# Patient Record
Sex: Female | Born: 1985 | Race: Black or African American | Hispanic: No | Marital: Single | Smoking: Current every day smoker
Health system: Southern US, Community
[De-identification: ages and names within clinical notes are randomized; demographics above are authoritative.]

## PROBLEM LIST (undated history)

## (undated) DIAGNOSIS — J45909 Unspecified asthma, uncomplicated: Secondary | ICD-10-CM

## (undated) HISTORY — PX: OVARIAN CYST REMOVAL: SHX89

---

## 2005-03-26 ENCOUNTER — Emergency Department (HOSPITAL_COMMUNITY): Admission: EM | Admit: 2005-03-26 | Discharge: 2005-03-26 | Payer: Self-pay | Admitting: Family Medicine

## 2005-04-15 ENCOUNTER — Emergency Department (HOSPITAL_COMMUNITY): Admission: EM | Admit: 2005-04-15 | Discharge: 2005-04-15 | Payer: Self-pay | Admitting: Family Medicine

## 2007-01-21 ENCOUNTER — Emergency Department (HOSPITAL_COMMUNITY): Admission: EM | Admit: 2007-01-21 | Discharge: 2007-01-21 | Payer: Self-pay

## 2008-05-05 ENCOUNTER — Inpatient Hospital Stay (HOSPITAL_COMMUNITY): Admission: AD | Admit: 2008-05-05 | Discharge: 2008-05-05 | Payer: Self-pay | Admitting: Obstetrics & Gynecology

## 2012-06-08 ENCOUNTER — Encounter (HOSPITAL_BASED_OUTPATIENT_CLINIC_OR_DEPARTMENT_OTHER): Payer: Self-pay | Admitting: *Deleted

## 2012-06-08 ENCOUNTER — Emergency Department (HOSPITAL_BASED_OUTPATIENT_CLINIC_OR_DEPARTMENT_OTHER)
Admission: EM | Admit: 2012-06-08 | Discharge: 2012-06-09 | Disposition: A | Discharge status: Home / Self Care | Payer: Medicaid Other | Attending: Emergency Medicine | Admitting: Emergency Medicine

## 2012-06-08 DIAGNOSIS — L03211 Cellulitis of face: Secondary | ICD-10-CM | POA: Insufficient documentation

## 2012-06-08 DIAGNOSIS — K011 Impacted teeth: Secondary | ICD-10-CM

## 2012-06-08 DIAGNOSIS — K006 Disturbances in tooth eruption: Secondary | ICD-10-CM | POA: Insufficient documentation

## 2012-06-08 DIAGNOSIS — Z885 Allergy status to narcotic agent status: Secondary | ICD-10-CM | POA: Insufficient documentation

## 2012-06-08 DIAGNOSIS — F17200 Nicotine dependence, unspecified, uncomplicated: Secondary | ICD-10-CM | POA: Insufficient documentation

## 2012-06-08 DIAGNOSIS — L0201 Cutaneous abscess of face: Secondary | ICD-10-CM | POA: Insufficient documentation

## 2012-06-08 HISTORY — DX: Unspecified asthma, uncomplicated: J45.909

## 2012-06-08 NOTE — ED Notes (Signed)
Pt states she has had dental pain x 1 week and also noticed a raised reddened area on her forehead yesterday.

## 2012-06-09 MED ORDER — HYDROCODONE-ACETAMINOPHEN 5-325 MG PO TABS: 1.0000 | ORAL_TABLET | Freq: Four times a day (QID) | ORAL | Status: AC | PRN

## 2012-06-09 MED ORDER — DOXYCYCLINE HYCLATE 100 MG PO TABS
100.0000 mg | ORAL_TABLET | Freq: Once | ORAL | Status: AC
  Administered 2012-06-09: 100 mg via ORAL
  Filled 2012-06-09: qty 1

## 2012-06-09 MED ORDER — DOXYCYCLINE HYCLATE 100 MG PO CAPS: 100.0000 mg | ORAL_CAPSULE | Freq: Two times a day (BID) | ORAL | Status: AC

## 2012-06-09 NOTE — ED Provider Notes (Signed)
History   This chart was scribed for Hanley Seamen, MD by Toya Smothers. The patient was seen in room MH04/MH04. Patient's care was started at 2343.  CSN: [id number]  Arrival date & time 06/08/12  2343   First MD Initiated Contact with Patient 06/09/12 0020      Chief Complaint  Patient presents with  . Dental Pain   Patient is a 26 y.o. female presenting with tooth pain. The history is provided by the patient. No language interpreter was used.  Dental PainPrimary symptoms do not include headaches, fever, shortness of breath or cough.   Meagan Sellers is a 26 y.o. female who presents to the Emergency Department complaining of onset moderate-severe constant L third molar pain onset 1 week ago. Pain is improving, moderate. Pt also developed pustule  on the bridge of the nose which concerned her. The dental pain is exacerbated by ED in. The pustule is tender to palpation. She denies fevers or chills. She does have some left anterior cervical lymphadenopathy.  Past Medical History  Diagnosis Date  . Asthma     History reviewed. No pertinent past surgical history.  History reviewed. No pertinent family history.  History  Substance Use Topics  . Smoking status: Current Everyday Smoker  . Smokeless tobacco: Not on file  . Alcohol Use: Yes   Review of Systems  Constitutional: Negative for fever.  HENT: Positive for dental problem. Negative for rhinorrhea.        Pustule on bridge of nose.  Eyes: Negative for pain.  Respiratory: Negative for cough and shortness of breath.   Cardiovascular: Negative for chest pain.  Gastrointestinal: Negative for nausea, vomiting, abdominal pain and diarrhea.  Genitourinary: Negative for dysuria.  Musculoskeletal: Negative for back pain.  Skin: Negative for rash.  Neurological: Negative for weakness and headaches.  All other systems reviewed and are negative.    Allergies  Demerol  Home Medications  No current outpatient prescriptions on  file.  BP 119/71  Pulse 74  Temp 98.2 F (36.8 C) (Oral)  Resp 18  Ht 5\' 3"  (1.6 m)  Wt 142 lb (64.411 kg)  BMI 25.15 kg/m2  SpO2 100%  Physical Exam  Nursing note and vitals reviewed. Constitutional: She is oriented to person, place, and time. She appears well-developed and well-nourished. No distress.  HENT:  Head: Normocephalic and atraumatic.       Partially erupted wisdom teeth.  Non-pointing non-fluctuating abscess of Left bridge of the nose.  Eyes: EOM are normal. Pupils are equal, round, and reactive to light.  Neck: Neck supple. No tracheal deviation present.  Cardiovascular: Normal rate.   Pulmonary/Chest: Effort normal. No respiratory distress.  Abdominal: Soft. She exhibits no distension.  Musculoskeletal: Normal range of motion. She exhibits no edema.  Lymphadenopathy:       Left anterior cervical lymphadenopathy  Neurological: She is alert and oriented to person, place, and time. No sensory deficit.  Skin: Skin is warm and dry.  Psychiatric: She has a normal mood and affect. Her behavior is normal.    ED Course  Procedures (including critical care time) DIAGNOSTIC STUDIES: Oxygen Saturation is 100% on room air, normal by my interpretation.    COORDINATION OF CARE: 2423-Evaluated state of Pt's present illness. Pt will need to see oral surgeon.    MDM  I personally performed the services described in this documentation, which was scribed in my presence.  The recorded information has been reviewed and considered. We'll treat the patient's abscess with  doxycycline as it is not yet ready for I&D. She was advised to return if it worsens. We will refer her to an oral surgeon for her impacted wisdom teeth. No caries or evidence of abscess were seen on exam.      Hanley Seamen, MD 06/09/12 [phone]

## 2012-12-29 ENCOUNTER — Emergency Department (HOSPITAL_BASED_OUTPATIENT_CLINIC_OR_DEPARTMENT_OTHER): Payer: Medicaid Other

## 2012-12-29 ENCOUNTER — Encounter (HOSPITAL_BASED_OUTPATIENT_CLINIC_OR_DEPARTMENT_OTHER): Payer: Self-pay | Admitting: *Deleted

## 2012-12-29 ENCOUNTER — Emergency Department (HOSPITAL_BASED_OUTPATIENT_CLINIC_OR_DEPARTMENT_OTHER)
Admission: EM | Admit: 2012-12-29 | Discharge: 2012-12-29 | Disposition: A | Discharge status: Home / Self Care | Payer: Medicaid Other | Attending: Emergency Medicine | Admitting: Emergency Medicine

## 2012-12-29 DIAGNOSIS — R51 Headache: Secondary | ICD-10-CM | POA: Insufficient documentation

## 2012-12-29 DIAGNOSIS — IMO0001 Reserved for inherently not codable concepts without codable children: Secondary | ICD-10-CM | POA: Insufficient documentation

## 2012-12-29 DIAGNOSIS — J3489 Other specified disorders of nose and nasal sinuses: Secondary | ICD-10-CM | POA: Insufficient documentation

## 2012-12-29 DIAGNOSIS — R509 Fever, unspecified: Secondary | ICD-10-CM | POA: Insufficient documentation

## 2012-12-29 DIAGNOSIS — R112 Nausea with vomiting, unspecified: Secondary | ICD-10-CM | POA: Insufficient documentation

## 2012-12-29 DIAGNOSIS — R05 Cough: Secondary | ICD-10-CM | POA: Insufficient documentation

## 2012-12-29 DIAGNOSIS — F17200 Nicotine dependence, unspecified, uncomplicated: Secondary | ICD-10-CM | POA: Insufficient documentation

## 2012-12-29 DIAGNOSIS — J45909 Unspecified asthma, uncomplicated: Secondary | ICD-10-CM | POA: Insufficient documentation

## 2012-12-29 DIAGNOSIS — J111 Influenza due to unidentified influenza virus with other respiratory manifestations: Secondary | ICD-10-CM

## 2012-12-29 DIAGNOSIS — R059 Cough, unspecified: Secondary | ICD-10-CM | POA: Insufficient documentation

## 2012-12-29 DIAGNOSIS — R071 Chest pain on breathing: Secondary | ICD-10-CM | POA: Insufficient documentation

## 2012-12-29 DIAGNOSIS — Z79899 Other long term (current) drug therapy: Secondary | ICD-10-CM | POA: Insufficient documentation

## 2012-12-29 DIAGNOSIS — R630 Anorexia: Secondary | ICD-10-CM | POA: Insufficient documentation

## 2012-12-29 LAB — URINE MICROSCOPIC-ADD ON

## 2012-12-29 LAB — URINALYSIS, ROUTINE W REFLEX MICROSCOPIC
Leukocytes, UA: NEGATIVE
Nitrite: NEGATIVE
Protein, ur: 100 mg/dL — AB
Specific Gravity, Urine: 1.043 — ABNORMAL HIGH (ref 1.005–1.030)

## 2012-12-29 MED ORDER — ALBUTEROL SULFATE HFA 108 (90 BASE) MCG/ACT IN AERS: 2.0000 | INHALATION_SPRAY | RESPIRATORY_TRACT | Status: AC | PRN

## 2012-12-29 MED ORDER — KETOROLAC TROMETHAMINE 60 MG/2ML IM SOLN
30.0000 mg | Freq: Once | INTRAMUSCULAR | Status: AC
  Administered 2012-12-29: 30 mg via INTRAMUSCULAR
  Filled 2012-12-29: qty 2

## 2012-12-29 MED ORDER — ONDANSETRON 8 MG PO TBDP: ORAL_TABLET | ORAL | Status: DC

## 2012-12-29 MED ORDER — METOCLOPRAMIDE HCL 10 MG PO TABS: 10.0000 mg | ORAL_TABLET | Freq: Four times a day (QID) | ORAL | Status: DC | PRN

## 2012-12-29 NOTE — ED Provider Notes (Signed)
History   This chart was scribed for Hurman Horn, MD by Leone Payor, ED Scribe. This patient was seen in room MHOTF/OTF and the patient's care was started at 2055.   CSN: [id number]  Arrival date & time 12/29/12  1903   First MD Initiated Contact with Patient 12/29/12 2055      Chief Complaint  Patient presents with  . Cough     The history is provided by the patient. No language interpreter was used.    Meagan Sellers is a 27 y.o. female who presents to the Emergency Department complaining of new, unchanged, constant,  moderate cough starting 2 days ago. Pt has associated chills, fever, body aches, nausea, vomiting from excessive coughing, congestion, headaches, decreased appetite. Pt has inhaler at home. Pt has not had a bowel movement today. She denies abdominal pain, rash, visual changes, dysuria.   Pt has h/o asthma.  Pt is a current everyday smoker and occasional alcohol user.  Past Medical History  Diagnosis Date  . Asthma     History reviewed. No pertinent past surgical history.  No family history on file.  History  Substance Use Topics  . Smoking status: Current Every Day Smoker  . Smokeless tobacco: Not on file  . Alcohol Use: Yes    No OB history provided.   Review of Systems 10 Systems reviewed and all are negative for acute change except as noted in the HPI.    Allergies  Demerol  Home Medications   Current Outpatient Rx  Name  Route  Sig  Dispense  Refill  . ALBUTEROL SULFATE HFA 108 (90 BASE) MCG/ACT IN AERS   Inhalation   Inhale 2 puffs into the lungs every 4 (four) hours as needed for wheezing or shortness of breath (cough).   1 Inhaler   0   . METOCLOPRAMIDE HCL 10 MG PO TABS   Oral   Take 1 tablet (10 mg total) by mouth every 6 (six) hours as needed (nausea/headache).   6 tablet   0   . ONDANSETRON 8 MG PO TBDP      8mg  ODT q4 hours prn nausea   4 tablet   0     BP 120/80  Pulse 71  Temp 98.6 F (37 C) (Oral)  Resp 18   SpO2 100%  Physical Exam  Nursing note and vitals reviewed. Constitutional:       Awake, alert, nontoxic appearance.  HENT:  Head: Atraumatic.  Right Ear: External ear normal.  Left Ear: External ear normal.  Mouth/Throat: Oropharynx is clear and moist.  Eyes: Right eye exhibits no discharge. Left eye exhibits no discharge.  Neck: Neck supple.  Cardiovascular: Normal rate, regular rhythm and normal heart sounds.   Pulmonary/Chest: Effort normal and breath sounds normal. She exhibits no tenderness.       Diffuse chest wall tenderness.   Abdominal: Soft. There is no tenderness. There is no rebound.  Musculoskeletal: She exhibits no tenderness.       Baseline ROM, no obvious new focal weakness.  Diffuse back and all 4 extremity tenderness.   Lymphadenopathy:    She has no cervical adenopathy.  Neurological:       Mental status and motor strength appears baseline for patient and situation.  Skin: No rash noted.  Psychiatric: She has a normal mood and affect.    ED Course  Procedures (including critical care time)  DIAGNOSTIC STUDIES: Oxygen Saturation is 100% on room air, normal by my interpretation.  COORDINATION OF CARE:  9:01PM Patient / Family / Caregiver informed of clinical course, understand medical decision-making process, and agree with plan.   9:26PM Pt requests a Toradol shot before she leaves the ED.   Labs Reviewed  URINALYSIS, ROUTINE W REFLEX MICROSCOPIC - Abnormal; Notable for the following:    Color, Urine AMBER (*)  BIOCHEMICALS MAY BE AFFECTED BY COLOR   APPearance CLOUDY (*)     Specific Gravity, Urine 1.043 (*)     Hgb urine dipstick SMALL (*)     Bilirubin Urine SMALL (*)     Ketones, ur 15 (*)     Protein, ur 100 (*)     All other components within normal limits  URINE MICROSCOPIC-ADD ON - Abnormal; Notable for the following:    Squamous Epithelial / LPF FEW (*)     Bacteria, UA FEW (*)     All other components within normal limits   Dg  Chest 2 View  12/29/2012  *RADIOLOGY REPORT*  Clinical Data: Cough.  Fever.  Chills.  Body aches.  CHEST - 2 VIEW  Comparison: None.  Findings:  Cardiopericardial silhouette within normal limits. Mediastinal contours normal. Trachea midline.  No airspace disease or effusion.  IMPRESSION: No acute cardiopulmonary disease.   Original Report Authenticated By: Andreas Newport, M.D.      1. Influenza       MDM   I personally performed the services described in this documentation, which was scribed in my presence. The recorded information has been reviewed and is accurate.  Patient / Family / Caregiver informed of clinical course, understand medical decision-making process, and agree with plan.  I doubt any other EMC precluding discharge at this time including, but not necessarily limited to the following: SBI.  Hurman Horn, MD 12/30/12 [phone]

## 2012-12-29 NOTE — ED Notes (Signed)
Cough, body aches, vomiting, nausea, headache x 2 days.

## 2012-12-29 NOTE — ED Notes (Signed)
Pt. Reports pain with cough.

## 2013-03-08 ENCOUNTER — Encounter (HOSPITAL_BASED_OUTPATIENT_CLINIC_OR_DEPARTMENT_OTHER): Payer: Self-pay | Admitting: *Deleted

## 2013-03-08 DIAGNOSIS — Z79899 Other long term (current) drug therapy: Secondary | ICD-10-CM | POA: Insufficient documentation

## 2013-03-08 DIAGNOSIS — F17200 Nicotine dependence, unspecified, uncomplicated: Secondary | ICD-10-CM | POA: Insufficient documentation

## 2013-03-08 DIAGNOSIS — J45909 Unspecified asthma, uncomplicated: Secondary | ICD-10-CM | POA: Insufficient documentation

## 2013-03-08 DIAGNOSIS — M62838 Other muscle spasm: Secondary | ICD-10-CM | POA: Insufficient documentation

## 2013-03-08 NOTE — ED Notes (Signed)
Pt reports right shoulder pain x 1 month- has been using ibuprofen without relief- denies known injury

## 2013-03-09 ENCOUNTER — Emergency Department (HOSPITAL_BASED_OUTPATIENT_CLINIC_OR_DEPARTMENT_OTHER)
Admission: EM | Admit: 2013-03-09 | Discharge: 2013-03-09 | Disposition: A | Discharge status: Home / Self Care | Payer: Medicaid Other | Attending: Emergency Medicine | Admitting: Emergency Medicine

## 2013-03-09 ENCOUNTER — Emergency Department (HOSPITAL_BASED_OUTPATIENT_CLINIC_OR_DEPARTMENT_OTHER)
Admit: 2013-03-09 | Discharge: 2013-03-09 | Disposition: A | Discharge status: Home / Self Care | Payer: Medicaid Other | Attending: Emergency Medicine | Admitting: Emergency Medicine

## 2013-03-09 DIAGNOSIS — M62838 Other muscle spasm: Secondary | ICD-10-CM

## 2013-03-09 MED ORDER — TRAMADOL HCL 50 MG PO TABS: 50.0000 mg | ORAL_TABLET | Freq: Four times a day (QID) | ORAL | Status: DC | PRN

## 2013-03-09 MED ORDER — METHOCARBAMOL 500 MG PO TABS: 500.0000 mg | ORAL_TABLET | Freq: Two times a day (BID) | ORAL | Status: DC

## 2013-03-09 NOTE — ED Provider Notes (Signed)
History  This chart was scribed for Myanna Ziesmer Smitty Cords, MD by Shari Heritage, ED Scribe. The patient was seen in room MH06/MH06. Patient's care was started at 0041.   CSN: [id number]  Arrival date & time 03/08/13  2321   First MD Initiated Contact with Patient 03/09/13 0041      Chief Complaint  Patient presents with  . Shoulder Pain     Patient is a 27 y.o. female presenting with shoulder pain. The history is provided by the patient. No language interpreter was used.  Shoulder Pain This is a new problem. The current episode started more than 1 week ago. The problem occurs constantly. The problem has not changed since onset.Pertinent negatives include no chest pain and no shortness of breath. Exacerbated by: ROM of shoulder, pressure to the area, use. Nothing relieves the symptoms. Treatments tried: NSAIDs. The treatment provided no relief.    HPI Comments: Meagan Sellers is a 27 y.o. female who presents to the Emergency Department complaining of persistent, moderate, dull right shoulder pain that radiates down her arm onset 3-4 weeks ago. Pain is worse with ROM of the shoulder, use and pressure to the area. She cannot identify any specific injury or trauma precipitating pain. Patient is right-handed. She has no prior history of injury to the same area. Patient has been taking ibuprofen without relief. She denies any other pain at this time. She has a medical history of asthma.      Past Medical History  Diagnosis Date  . Asthma     Past Surgical History  Procedure Laterality Date  . Ovarian cyst removal      No family history on file.  History  Substance Use Topics  . Smoking status: Current Every Day Smoker  . Smokeless tobacco: Never Used  . Alcohol Use: 1.8 oz/week    3 Cans of beer per week    OB History   Grav Para Term Preterm Abortions TAB SAB Ect Mult Living                  Review of Systems  Respiratory: Negative for shortness of breath.    Cardiovascular: Negative for chest pain.  Neurological: Negative for weakness and numbness.  All other systems reviewed and are negative.    Allergies  Demerol  Home Medications   Current Outpatient Rx  Name  Route  Sig  Dispense  Refill  . albuterol (PROVENTIL HFA;VENTOLIN HFA) 108 (90 BASE) MCG/ACT inhaler   Inhalation   Inhale 2 puffs into the lungs every 4 (four) hours as needed for wheezing or shortness of breath (cough).   1 Inhaler   0   . ibuprofen (ADVIL,MOTRIN) 200 MG tablet   Oral   Take 200 mg by mouth every 6 (six) hours as needed for pain.         Marland Kitchen metoCLOPramide (REGLAN) 10 MG tablet   Oral   Take 1 tablet (10 mg total) by mouth every 6 (six) hours as needed (nausea/headache).   6 tablet   0   . ondansetron (ZOFRAN ODT) 8 MG disintegrating tablet      8mg  ODT q4 hours prn nausea   4 tablet   0     Triage Vitals: BP 127/79  Pulse 83  Temp(Src) 98.4 F (36.9 C) (Oral)  Resp 20  Ht 5\' 4"  (1.626 m)  Wt 140 lb (63.504 kg)  BMI 24.02 kg/m2  SpO2 99%  Physical Exam  Constitutional: She is oriented to person,  place, and time. She appears well-developed and well-nourished.  HENT:  Head: Normocephalic and atraumatic.  Mouth/Throat: Oropharynx is clear and moist. No oropharyngeal exudate.  Eyes: Pupils are equal, round, and reactive to light.  Neck: Normal range of motion. Neck supple.  Cardiovascular: Normal rate, regular rhythm and intact distal pulses.   Pulmonary/Chest: Effort normal and breath sounds normal. She has no wheezes. She has no rales.  Abdominal: Soft. Bowel sounds are normal. There is no tenderness.  Musculoskeletal: Normal range of motion.       Right shoulder: She exhibits spasm (trapezius, right).  Negative neers test. Intact bicep tendon. Intact triceps. NO winging of scapula. No clavicular step offs. 5/5 grip strength. Neurovascularly intact. Spasm of trapezius on the right side.   Neurological: She is alert and oriented to  person, place, and time. She has normal reflexes. She displays normal reflexes. Coordination normal.  Skin: Skin is warm and dry.  Psychiatric: She has a normal mood and affect. Her behavior is normal.    ED Course  Procedures (including critical care time) DIAGNOSTIC STUDIES: Oxygen Saturation is 99% on room air, normal by my interpretation.    COORDINATION OF CARE: 1:08 AM- Patient informed of current plan for treatment and evaluation and agrees with plan at this time.      Labs Reviewed - No data to display No results found.   No diagnosis found.    MDM  Muscles spasm.      I personally performed the services described in this documentation, which was scribed in my presence. The recorded information has been reviewed and is accurate.    Jasmine Awe, MD 03/09/13 [phone]

## 2013-03-09 NOTE — ED Notes (Signed)
rx x 2 given for robaxin and ultram

## 2013-03-22 ENCOUNTER — Encounter (HOSPITAL_BASED_OUTPATIENT_CLINIC_OR_DEPARTMENT_OTHER): Payer: Self-pay | Admitting: *Deleted

## 2013-03-22 ENCOUNTER — Emergency Department (HOSPITAL_BASED_OUTPATIENT_CLINIC_OR_DEPARTMENT_OTHER)
Admission: EM | Admit: 2013-03-22 | Discharge: 2013-03-22 | Disposition: A | Discharge status: Home / Self Care | Payer: Medicaid Other | Attending: Emergency Medicine | Admitting: Emergency Medicine

## 2013-03-22 DIAGNOSIS — R209 Unspecified disturbances of skin sensation: Secondary | ICD-10-CM | POA: Insufficient documentation

## 2013-03-22 DIAGNOSIS — M25519 Pain in unspecified shoulder: Secondary | ICD-10-CM | POA: Insufficient documentation

## 2013-03-22 DIAGNOSIS — M25511 Pain in right shoulder: Secondary | ICD-10-CM

## 2013-03-22 DIAGNOSIS — J45909 Unspecified asthma, uncomplicated: Secondary | ICD-10-CM

## 2013-03-22 DIAGNOSIS — F17200 Nicotine dependence, unspecified, uncomplicated: Secondary | ICD-10-CM | POA: Insufficient documentation

## 2013-03-22 DIAGNOSIS — M542 Cervicalgia: Secondary | ICD-10-CM | POA: Insufficient documentation

## 2013-03-22 DIAGNOSIS — Z79899 Other long term (current) drug therapy: Secondary | ICD-10-CM | POA: Insufficient documentation

## 2013-03-22 MED ORDER — IBUPROFEN 800 MG PO TABS
800.0000 mg | ORAL_TABLET | Freq: Once | ORAL | Status: AC
  Administered 2013-03-22: 800 mg via ORAL

## 2013-03-22 MED ORDER — IBUPROFEN 600 MG PO TABS: 600.0000 mg | ORAL_TABLET | Freq: Four times a day (QID) | ORAL | Status: DC | PRN

## 2013-03-22 MED ORDER — IBUPROFEN 400 MG PO TABS
600.0000 mg | ORAL_TABLET | Freq: Once | ORAL | Status: DC
  Filled 2013-03-22: qty 1

## 2013-03-22 MED ORDER — HYDROCODONE-ACETAMINOPHEN 5-325 MG PO TABS: 2.0000 | ORAL_TABLET | ORAL | Status: DC | PRN

## 2013-03-22 NOTE — ED Notes (Signed)
Patient reports that she has ongoing right posterior shoulder pain with radiation down right arm. Seen 2 weeks ago for same and took meds as prescribed without relief

## 2013-03-22 NOTE — ED Provider Notes (Signed)
History     CSN: [id number]  Arrival date & time 03/22/13  2048   First MD Initiated Contact with Patient 03/22/13 2105      Chief Complaint  Patient presents with  . Shoulder Pain    (Consider location/radiation/quality/duration/timing/severity/associated sxs/prior treatment) HPI Comments: Patient is a 27 y/o F with PMHx of asthma c/o shoulder pain x 1-2 months. Patient described shoulder pain as a constant sharp shooting discomfort that is constant - with stiffness in the morning. Patient reported radiation to entire right arm, all the way down to the fingertips, patient reported that it is common for her fingertips to go numb. Patient reported that right arm pain is constant, more of a dull, aching pain - mainly to the medial aspect. Patient also reported pain radiating to the right side of the neck. Patient reported that intermittently throughout the day the arm gets weak. Patient denied shortness of breathe, difficulty breathing, chest pain, abdominal pain, injury, trauma.   Patient was seen recently in the ED for similar complaints, 03/09/2013.   Patient is a 27 y.o. female presenting with shoulder pain. The history is provided by the patient. No language interpreter was used.  Shoulder Pain This is a recurrent problem. The current episode started more than 1 month ago. The problem occurs daily. The problem has been unchanged. Associated symptoms include arthralgias, neck pain and numbness. Pertinent negatives include no abdominal pain, change in bowel habit, chest pain, chills, congestion, coughing, fever, headaches, joint swelling, nausea, sore throat, urinary symptoms, visual change or vomiting. She has tried oral narcotics for the symptoms. The treatment provided no relief.    Past Medical History  Diagnosis Date  . Asthma     Past Surgical History  Procedure Laterality Date  . Ovarian cyst removal      No family history on file.  History  Substance Use Topics  .  Smoking status: Current Every Day Smoker  . Smokeless tobacco: Never Used  . Alcohol Use: 1.8 oz/week    3 Cans of beer per week    OB History   Grav Para Term Preterm Abortions TAB SAB Ect Mult Living                  Review of Systems  Constitutional: Negative for fever and chills.  HENT: Positive for neck pain. Negative for ear pain, congestion, sore throat and tinnitus.   Respiratory: Negative for cough and chest tightness.   Cardiovascular: Negative for chest pain.  Gastrointestinal: Negative for nausea, vomiting, abdominal pain and change in bowel habit.  Musculoskeletal: Positive for arthralgias. Negative for back pain and joint swelling.       Right shoulder pain  Neurological: Positive for numbness. Negative for headaches.  All other systems reviewed and are negative.    Allergies  Demerol  Home Medications   Current Outpatient Rx  Name  Route  Sig  Dispense  Refill  . albuterol (PROVENTIL HFA;VENTOLIN HFA) 108 (90 BASE) MCG/ACT inhaler   Inhalation   Inhale 2 puffs into the lungs every 4 (four) hours as needed for wheezing or shortness of breath (cough).   1 Inhaler   0   . HYDROcodone-acetaminophen (NORCO) 5-325 MG per tablet   Oral   Take 2 tablets by mouth every 4 (four) hours as needed for pain.   10 tablet   0   . ibuprofen (ADVIL,MOTRIN) 200 MG tablet   Oral   Take 200 mg by mouth every 6 (six) hours as  needed for pain.         Marland Kitchen ibuprofen (ADVIL,MOTRIN) 600 MG tablet   Oral   Take 1 tablet (600 mg total) by mouth every 6 (six) hours as needed for pain.   15 tablet   0   . methocarbamol (ROBAXIN) 500 MG tablet   Oral   Take 1 tablet (500 mg total) by mouth 2 (two) times daily.   20 tablet   0   . metoCLOPramide (REGLAN) 10 MG tablet   Oral   Take 1 tablet (10 mg total) by mouth every 6 (six) hours as needed (nausea/headache).   6 tablet   0   . ondansetron (ZOFRAN ODT) 8 MG disintegrating tablet      8mg  ODT q4 hours prn nausea    4 tablet   0   . traMADol (ULTRAM) 50 MG tablet   Oral   Take 1 tablet (50 mg total) by mouth every 6 (six) hours as needed for pain.   15 tablet   0     BP 115/78  Pulse 85  Temp(Src) 98.2 F (36.8 C) (Oral)  Resp 18  SpO2 98%  Physical Exam  Nursing note and vitals reviewed. Constitutional: She is oriented to person, place, and time. She appears well-developed and well-nourished. No distress.  HENT:  Head: Normocephalic and atraumatic.  Eyes: Conjunctivae and EOM are normal. Pupils are equal, round, and reactive to light. Right eye exhibits no discharge.  Neck: Normal range of motion. Neck supple. No thyromegaly present.  Cardiovascular: Normal rate, regular rhythm, normal heart sounds and intact distal pulses.  Exam reveals no friction rub.   No murmur heard. Pulmonary/Chest: Effort normal and breath sounds normal. No respiratory distress. She has no wheezes. She has no rales.  Musculoskeletal: Normal range of motion. She exhibits tenderness. She exhibits no edema.  Pain upon palpation to posterior aspect of cervical neck, right shoulder, acromion of right shoulder, medial aspect of right arm.  Full ROM.  Sensation intact.  Pulses palpable - radial pulse 2+ Positive Yergason sign to right arm.  Negative Neers sign to right arm.  Bicep and tricep intact.    Lymphadenopathy:    She has no cervical adenopathy.  Neurological: She is alert and oriented to person, place, and time. No cranial nerve deficit. She exhibits normal muscle tone. Coordination normal.  Skin: Skin is warm and dry. No rash noted. She is not diaphoretic. No erythema.  Psychiatric: She has a normal mood and affect. Her behavior is normal. Thought content normal.    ED Course  Procedures (including critical care time)  Labs Reviewed - No data to display No results found.   1. Right shoulder pain   2. Asthma       MDM  Patient was recently seen in the ED for similar reason on 03/09/2013 - right  shoulder xray was performed with no fracture or dislocation noted. Suspected cervical radiculopathy due to presentation and physical exam, possible musculoskeletal injury or bursitis. Patient afebrile, normotensive, nontachycardic, alert, happy affect. Patient aseptic and not toxic appearing. No neurovascular damaged noted. Discharged patient. Patient was discharged with pain medications and NSAIDs. Instructed patient on how to take pain medications - to not drive, drink alcohol, use drugs, operate heavy machinery when using medications. Discharged patient with sling to wear throughout the day. Discussed with patient to follow-up with orthopedics, stressed the importance of following up regarding condition. Gave patient letter for work. Discussed with patient that if symptoms are  to worsen to report back to the ED.  Patient agreed to plan of care, understood, all questions answered.        Raymon Mutton, PA-C 03/23/13 [phone]

## 2013-03-24 NOTE — ED Provider Notes (Signed)
Medical screening examination/treatment/procedure(s) were performed by non-physician practitioner and as supervising physician I was immediately available for consultation/collaboration.  Christopher J. Pollina, MD 03/24/13 0846 

## 2013-11-07 ENCOUNTER — Emergency Department (HOSPITAL_BASED_OUTPATIENT_CLINIC_OR_DEPARTMENT_OTHER)
Admission: EM | Admit: 2013-11-07 | Discharge: 2013-11-07 | Disposition: A | Discharge status: Home / Self Care | Payer: Medicaid Other | Attending: Emergency Medicine | Admitting: Emergency Medicine

## 2013-11-07 ENCOUNTER — Encounter (HOSPITAL_BASED_OUTPATIENT_CLINIC_OR_DEPARTMENT_OTHER): Payer: Self-pay | Admitting: Emergency Medicine

## 2013-11-07 DIAGNOSIS — N760 Acute vaginitis: Secondary | ICD-10-CM | POA: Insufficient documentation

## 2013-11-07 DIAGNOSIS — J45909 Unspecified asthma, uncomplicated: Secondary | ICD-10-CM | POA: Insufficient documentation

## 2013-11-07 DIAGNOSIS — Z3202 Encounter for pregnancy test, result negative: Secondary | ICD-10-CM | POA: Insufficient documentation

## 2013-11-07 DIAGNOSIS — Z79899 Other long term (current) drug therapy: Secondary | ICD-10-CM | POA: Insufficient documentation

## 2013-11-07 DIAGNOSIS — R109 Unspecified abdominal pain: Secondary | ICD-10-CM

## 2013-11-07 DIAGNOSIS — B9689 Other specified bacterial agents as the cause of diseases classified elsewhere: Secondary | ICD-10-CM | POA: Insufficient documentation

## 2013-11-07 DIAGNOSIS — A499 Bacterial infection, unspecified: Secondary | ICD-10-CM | POA: Insufficient documentation

## 2013-11-07 DIAGNOSIS — F17200 Nicotine dependence, unspecified, uncomplicated: Secondary | ICD-10-CM | POA: Insufficient documentation

## 2013-11-07 LAB — URINALYSIS, ROUTINE W REFLEX MICROSCOPIC
Bilirubin Urine: NEGATIVE
Glucose, UA: NEGATIVE mg/dL
Hgb urine dipstick: NEGATIVE
Ketones, ur: NEGATIVE mg/dL
Nitrite: NEGATIVE
Protein, ur: NEGATIVE mg/dL
Specific Gravity, Urine: 1.023 (ref 1.005–1.030)
pH: 6 (ref 5.0–8.0)

## 2013-11-07 LAB — WET PREP, GENITAL: Yeast Wet Prep HPF POC: NONE SEEN

## 2013-11-07 MED ORDER — IBUPROFEN 200 MG PO TABS
ORAL_TABLET | ORAL | Status: AC
  Filled 2013-11-07: qty 1

## 2013-11-07 MED ORDER — METRONIDAZOLE 500 MG PO TABS: 500.0000 mg | ORAL_TABLET | Freq: Two times a day (BID) | ORAL | Status: DC

## 2013-11-07 MED ORDER — IBUPROFEN 400 MG PO TABS
600.0000 mg | ORAL_TABLET | Freq: Once | ORAL | Status: AC
  Administered 2013-11-07: 600 mg via ORAL

## 2013-11-07 MED ORDER — IBUPROFEN 400 MG PO TABS
ORAL_TABLET | ORAL | Status: AC
  Filled 2013-11-07: qty 1

## 2013-11-07 NOTE — ED Provider Notes (Signed)
CSN: [id number]     Arrival date & time 11/07/13  0450 History   First MD Initiated Contact with Patient 11/07/13 0502     Chief Complaint  Patient presents with  . Pelvic Pain   (Consider location/radiation/quality/duration/timing/severity/associated sxs/prior Treatment) HPI This is a 27 year old female with about a one-month history of intermittent pelvic pain. The pain is somewhat vaguely localized primarily in the suprapubic region with radiation to the right upper quadrant. It is worse with activity or urination. It sometimes worsens when she is eating. For the past few days she has noted some white vaginal discharge. She had some irregular bleeding this past month but was not like her usual periods. She states she's been sexually active only with a female friend. The pain is moderate at its worst and is mild when lying at rest.  Past Medical History  Diagnosis Date  . Asthma    Past Surgical History  Procedure Laterality Date  . Ovarian cyst removal     No family history on file. History  Substance Use Topics  . Smoking status: Current Every Day Smoker -- 1.00 packs/day    Types: Cigarettes  . Smokeless tobacco: Never Used  . Alcohol Use: 0.0 oz/week   OB History   Grav Para Term Preterm Abortions TAB SAB Ect Mult Living                 Review of Systems  All other systems reviewed and are negative.    Allergies  Demerol  Home Medications   Current Outpatient Rx  Name  Route  Sig  Dispense  Refill  . albuterol (PROVENTIL HFA;VENTOLIN HFA) 108 (90 BASE) MCG/ACT inhaler   Inhalation   Inhale 2 puffs into the lungs every 4 (four) hours as needed for wheezing or shortness of breath (cough).   1 Inhaler   0   . HYDROcodone-acetaminophen (NORCO) 5-325 MG per tablet   Oral   Take 2 tablets by mouth every 4 (four) hours as needed for pain.   10 tablet   0   . ibuprofen (ADVIL,MOTRIN) 200 MG tablet   Oral   Take 200 mg by mouth every 6 (six) hours as needed for  pain.         Marland Kitchen ibuprofen (ADVIL,MOTRIN) 600 MG tablet   Oral   Take 1 tablet (600 mg total) by mouth every 6 (six) hours as needed for pain.   15 tablet   0   . methocarbamol (ROBAXIN) 500 MG tablet   Oral   Take 1 tablet (500 mg total) by mouth 2 (two) times daily.   20 tablet   0   . metoCLOPramide (REGLAN) 10 MG tablet   Oral   Take 1 tablet (10 mg total) by mouth every 6 (six) hours as needed (nausea/headache).   6 tablet   0   . ondansetron (ZOFRAN ODT) 8 MG disintegrating tablet      8mg  ODT q4 hours prn nausea   4 tablet   0   . traMADol (ULTRAM) 50 MG tablet   Oral   Take 1 tablet (50 mg total) by mouth every 6 (six) hours as needed for pain.   15 tablet   0    BP 125/84  Pulse 69  Temp(Src) 98.5 F (36.9 C) (Oral)  Resp 16  Ht 5\' 4"  (1.626 m)  Wt 136 lb 4.8 oz (61.825 kg)  BMI 23.38 kg/m2  SpO2 100%  Physical Exam General: Well-developed, well-nourished female in  no acute distress; appearance consistent with age of record HENT: normocephalic; atraumatic Eyes: pupils equal, round and reactive to light; extraocular muscles intact Neck: supple Heart: regular rate and rhythm Lungs: clear to auscultation bilaterally Abdomen: soft; nondistended; suprapubic and right upper quadrant tenderness; no gallstones seen on bedside ultrasound; no masses or hepatosplenomegaly; bowel sounds present GU: Normal external genitalia; no vaginal bleeding; white, creamy vaginal discharge; no cervical motion tenderness; right adnexal tenderness Extremities: No deformity; full range of motion Neurologic: Awake, alert and oriented; motor function intact in all extremities and symmetric; no facial droop Skin: Warm and dry Psychiatric: Normal mood and affect   ED Course  Procedures (including critical care time)    MDM   Nursing notes and vitals signs, including pulse oximetry, reviewed.  Summary of this visit's results, reviewed by myself:  Labs:  Results for  orders placed during the hospital encounter of 11/07/13 (from the past 24 hour(s))  PREGNANCY, URINE     Status: None   Collection Time    11/07/13  5:00 AM      Result Value Range   Preg Test, Ur NEGATIVE  NEGATIVE  URINALYSIS, ROUTINE W REFLEX MICROSCOPIC     Status: None   Collection Time    11/07/13  5:00 AM      Result Value Range   Color, Urine YELLOW  YELLOW   APPearance CLEAR  CLEAR   Specific Gravity, Urine 1.023  1.005 - 1.030   pH 6.0  5.0 - 8.0   Glucose, UA NEGATIVE  NEGATIVE mg/dL   Hgb urine dipstick NEGATIVE  NEGATIVE   Bilirubin Urine NEGATIVE  NEGATIVE   Ketones, ur NEGATIVE  NEGATIVE mg/dL   Protein, ur NEGATIVE  NEGATIVE mg/dL   Urobilinogen, UA 1.0  0.0 - 1.0 mg/dL   Nitrite NEGATIVE  NEGATIVE   Leukocytes, UA NEGATIVE  NEGATIVE  WET PREP, GENITAL     Status: Abnormal   Collection Time    11/07/13  5:16 AM      Result Value Range   Yeast Wet Prep HPF POC NONE SEEN  NONE SEEN   Trich, Wet Prep NONE SEEN  NONE SEEN   Clue Cells Wet Prep HPF POC MODERATE (*) NONE SEEN   WBC, Wet Prep HPF POC MODERATE (*) NONE SEEN   We'll treat for bacterial vaginosis. We'll have her return Monday for ultrasound.     Hanley Seamen, MD 11/07/13 0530

## 2013-11-07 NOTE — ED Notes (Signed)
Pt reports about 1 month of intermittent discomfort with urination, bilateral pelvic pain with radiation to back.  Reports past few days with white vaginal discharge.  Also has had some spotting.

## 2013-11-07 NOTE — ED Notes (Signed)
MD at bedside. 

## 2013-11-09 ENCOUNTER — Ambulatory Visit (HOSPITAL_BASED_OUTPATIENT_CLINIC_OR_DEPARTMENT_OTHER): Payer: Medicaid Other

## 2013-11-09 ENCOUNTER — Other Ambulatory Visit (HOSPITAL_BASED_OUTPATIENT_CLINIC_OR_DEPARTMENT_OTHER): Payer: Self-pay | Admitting: Emergency Medicine

## 2013-11-09 DIAGNOSIS — R1011 Right upper quadrant pain: Secondary | ICD-10-CM

## 2013-11-09 DIAGNOSIS — R102 Pelvic and perineal pain: Secondary | ICD-10-CM

## 2013-11-09 DIAGNOSIS — R52 Pain, unspecified: Secondary | ICD-10-CM

## 2013-11-09 LAB — GC/CHLAMYDIA PROBE AMP
CT Probe RNA: NEGATIVE
GC Probe RNA: NEGATIVE

## 2013-11-10 ENCOUNTER — Ambulatory Visit (HOSPITAL_BASED_OUTPATIENT_CLINIC_OR_DEPARTMENT_OTHER): Payer: Medicaid Other

## 2013-11-10 ENCOUNTER — Ambulatory Visit (HOSPITAL_BASED_OUTPATIENT_CLINIC_OR_DEPARTMENT_OTHER): Admission: RE | Admit: 2013-11-10 | Payer: Medicaid Other | Source: Ambulatory Visit

## 2016-03-29 ENCOUNTER — Emergency Department (HOSPITAL_BASED_OUTPATIENT_CLINIC_OR_DEPARTMENT_OTHER): Payer: Medicaid Other

## 2016-03-29 ENCOUNTER — Emergency Department (HOSPITAL_BASED_OUTPATIENT_CLINIC_OR_DEPARTMENT_OTHER)
Admission: EM | Admit: 2016-03-29 | Discharge: 2016-03-29 | Disposition: A | Discharge status: Home / Self Care | Payer: Medicaid Other | Attending: Emergency Medicine | Admitting: Emergency Medicine

## 2016-03-29 ENCOUNTER — Encounter (HOSPITAL_BASED_OUTPATIENT_CLINIC_OR_DEPARTMENT_OTHER): Payer: Self-pay | Admitting: *Deleted

## 2016-03-29 DIAGNOSIS — R112 Nausea with vomiting, unspecified: Secondary | ICD-10-CM | POA: Insufficient documentation

## 2016-03-29 DIAGNOSIS — F17210 Nicotine dependence, cigarettes, uncomplicated: Secondary | ICD-10-CM | POA: Diagnosis not present

## 2016-03-29 DIAGNOSIS — R1013 Epigastric pain: Secondary | ICD-10-CM | POA: Diagnosis present

## 2016-03-29 DIAGNOSIS — R630 Anorexia: Secondary | ICD-10-CM | POA: Insufficient documentation

## 2016-03-29 DIAGNOSIS — R5383 Other fatigue: Secondary | ICD-10-CM | POA: Insufficient documentation

## 2016-03-29 DIAGNOSIS — N390 Urinary tract infection, site not specified: Secondary | ICD-10-CM

## 2016-03-29 DIAGNOSIS — J45909 Unspecified asthma, uncomplicated: Secondary | ICD-10-CM | POA: Insufficient documentation

## 2016-03-29 LAB — LIPASE, BLOOD: Lipase: 16 U/L (ref 11–51)

## 2016-03-29 LAB — URINE MICROSCOPIC-ADD ON

## 2016-03-29 LAB — COMPREHENSIVE METABOLIC PANEL
ALBUMIN: 3.9 g/dL (ref 3.5–5.0)
ALK PHOS: 71 U/L (ref 38–126)
ALT: 13 U/L — AB (ref 14–54)
AST: 27 U/L (ref 15–41)
Anion gap: 10 (ref 5–15)
BILIRUBIN TOTAL: 2.4 mg/dL — AB (ref 0.3–1.2)
BUN: 13 mg/dL (ref 6–20)
CALCIUM: 8.8 mg/dL — AB (ref 8.9–10.3)
CO2: 23 mmol/L (ref 22–32)
CREATININE: 0.69 mg/dL (ref 0.44–1.00)
Chloride: 102 mmol/L (ref 101–111)
GFR calc Af Amer: >60 mL/min (ref >60)
GFR calc non Af Amer: >60 mL/min (ref >60)
GLUCOSE: 101 mg/dL — AB (ref 65–99)
Potassium: 3.2 mmol/L — ABNORMAL LOW (ref 3.5–5.1)
SODIUM: 135 mmol/L (ref 135–145)
TOTAL PROTEIN: 7.1 g/dL (ref 6.5–8.1)

## 2016-03-29 LAB — WET PREP, GENITAL
Clue Cells Wet Prep HPF POC: NONE SEEN
SPERM: NONE SEEN
Trich, Wet Prep: NONE SEEN
YEAST WET PREP: NONE SEEN

## 2016-03-29 LAB — URINALYSIS, ROUTINE W REFLEX MICROSCOPIC
GLUCOSE, UA: NEGATIVE mg/dL
KETONES UR: 40 mg/dL — AB
Nitrite: NEGATIVE
Protein, ur: 30 mg/dL — AB
Specific Gravity, Urine: 1.031 — ABNORMAL HIGH (ref 1.005–1.030)
pH: 6 (ref 5.0–8.0)

## 2016-03-29 LAB — PREGNANCY, URINE: Preg Test, Ur: NEGATIVE

## 2016-03-29 LAB — CBC WITH DIFFERENTIAL/PLATELET
Basophils Absolute: 0 10*3/uL (ref 0.0–0.1)
Basophils Relative: 0 %
EOS ABS: 0.1 10*3/uL (ref 0.0–0.7)
EOS PCT: 1 %
HCT: 35.8 % — ABNORMAL LOW (ref 36.0–46.0)
Hemoglobin: 12.7 g/dL (ref 12.0–15.0)
LYMPHS ABS: 1.2 10*3/uL (ref 0.7–4.0)
Lymphocytes Relative: 13 %
MCH: 33.5 pg (ref 26.0–34.0)
MCHC: 35.5 g/dL (ref 30.0–36.0)
MCV: 94.5 fL (ref 78.0–100.0)
MONO ABS: 0.4 10*3/uL (ref 0.1–1.0)
MONOS PCT: 4 %
Neutro Abs: 7.4 10*3/uL (ref 1.7–7.7)
Neutrophils Relative %: 82 %
PLATELETS: 240 10*3/uL (ref 150–400)
RBC: 3.79 MIL/uL — ABNORMAL LOW (ref 3.87–5.11)
RDW: 10.9 % — AB (ref 11.5–15.5)
WBC: 9.1 10*3/uL (ref 4.0–10.5)

## 2016-03-29 MED ORDER — SODIUM CHLORIDE 0.9 % IV BOLUS (SEPSIS)
1000.0000 mL | Freq: Once | INTRAVENOUS | Status: AC
  Administered 2016-03-29: 1000 mL via INTRAVENOUS

## 2016-03-29 MED ORDER — SODIUM CHLORIDE 0.9 % IV SOLN
INTRAVENOUS | Status: DC
Start: 2016-03-29 — End: 2016-03-29
  Administered 2016-03-29: 13:00:00 via INTRAVENOUS

## 2016-03-29 MED ORDER — POTASSIUM CHLORIDE CRYS ER 20 MEQ PO TBCR
40.0000 meq | EXTENDED_RELEASE_TABLET | Freq: Once | ORAL | Status: AC
  Administered 2016-03-29: 40 meq via ORAL
  Filled 2016-03-29: qty 2

## 2016-03-29 MED ORDER — MORPHINE SULFATE (PF) 4 MG/ML IV SOLN
4.0000 mg | Freq: Once | INTRAVENOUS | Status: AC
  Administered 2016-03-29: 4 mg via INTRAVENOUS
  Filled 2016-03-29: qty 1

## 2016-03-29 MED ORDER — NAPROXEN 500 MG PO TABS: 500.0000 mg | ORAL_TABLET | Freq: Two times a day (BID) | ORAL | Status: DC

## 2016-03-29 MED ORDER — IOPAMIDOL (ISOVUE-300) INJECTION 61%
100.0000 mL | Freq: Once | INTRAVENOUS | Status: AC | PRN
  Administered 2016-03-29: 100 mL via INTRAVENOUS

## 2016-03-29 MED ORDER — ONDANSETRON HCL 4 MG/2ML IJ SOLN
4.0000 mg | Freq: Once | INTRAMUSCULAR | Status: AC
  Administered 2016-03-29: 4 mg via INTRAVENOUS
  Filled 2016-03-29: qty 2

## 2016-03-29 MED ORDER — SULFAMETHOXAZOLE-TRIMETHOPRIM 800-160 MG PO TABS: 1.0000 | ORAL_TABLET | Freq: Two times a day (BID) | ORAL | Status: AC

## 2016-03-29 MED ORDER — DIPHENHYDRAMINE HCL 50 MG/ML IJ SOLN
12.5000 mg | Freq: Once | INTRAMUSCULAR | Status: AC
  Administered 2016-03-29: 12.5 mg via INTRAVENOUS
  Filled 2016-03-29: qty 1

## 2016-03-29 NOTE — Discharge Instructions (Signed)
Evaluation today shows that you have a urinary tract infection; otherwise, your labs and CT scan of the abdomen and pelvis are normal. Please take Bactrim twice daily for the next 10 days. You may take naproxen for your abdominal pain.  Urinary Tract Infection Urinary tract infections (UTIs) can develop anywhere along your urinary tract. Your urinary tract is your body's drainage system for removing wastes and extra water. Your urinary tract includes two kidneys, two ureters, a bladder, and a urethra. Your kidneys are a pair of bean-shaped organs. Each kidney is about the size of your fist. They are located below your ribs, one on each side of your spine. CAUSES Infections are caused by microbes, which are microscopic organisms, including fungi, viruses, and bacteria. These organisms are so small that they can only be seen through a microscope. Bacteria are the microbes that most commonly cause UTIs. SYMPTOMS  Symptoms of UTIs may vary by age and gender of the patient and by the location of the infection. Symptoms in young women typically include a frequent and intense urge to urinate and a painful, burning feeling in the bladder or urethra during urination. Older women and men are more likely to be tired, shaky, and weak and have muscle aches and abdominal pain. A fever may mean the infection is in your kidneys. Other symptoms of a kidney infection include pain in your back or sides below the ribs, nausea, and vomiting. DIAGNOSIS To diagnose a UTI, your caregiver will ask you about your symptoms. Your caregiver will also ask you to provide a urine sample. The urine sample will be tested for bacteria and white blood cells. White blood cells are made by your body to help fight infection. TREATMENT  Typically, UTIs can be treated with medication. Because most UTIs are caused by a bacterial infection, they usually can be treated with the use of antibiotics. The choice of antibiotic and length of treatment  depend on your symptoms and the type of bacteria causing your infection. HOME CARE INSTRUCTIONS  If you were prescribed antibiotics, take them exactly as your caregiver instructs you. Finish the medication even if you feel better after you have only taken some of the medication.  Drink enough water and fluids to keep your urine clear or pale yellow.  Avoid caffeine, tea, and carbonated beverages. They tend to irritate your bladder.  Empty your bladder often. Avoid holding urine for long periods of time.  Empty your bladder before and after sexual intercourse.  After a bowel movement, women should cleanse from front to back. Use each tissue only once. SEEK MEDICAL CARE IF:   You have back pain.  You develop a fever.  Your symptoms do not begin to resolve within 3 days. SEEK IMMEDIATE MEDICAL CARE IF:   You have severe back pain or lower abdominal pain.  You develop chills.  You have nausea or vomiting.  You have continued burning or discomfort with urination. MAKE SURE YOU:   Understand these instructions.  Will watch your condition.  Will get help right away if you are not doing well or get worse.   This information is not intended to replace advice given to you by your health care provider. Make sure you discuss any questions you have with your health care provider.   Document Released: 09/19/2005 Document Revised: 08/31/2015 Document Reviewed: 01/18/2012 Elsevier Interactive Patient Education [employer]2016 [employer] Inc[employer]

## 2016-03-29 NOTE — ED Provider Notes (Signed)
CSN: [id number]     Arrival date & time 03/29/16  1133 History   First MD Initiated Contact with Patient 03/29/16 1143     Chief Complaint  Patient presents with  . Abdominal Pain     (Consider location/radiation/quality/duration/timing/severity/associated sxs/prior Treatment) Patient is a 30 y.o. female presenting with abdominal pain.  Abdominal Pain Associated symptoms: anorexia, fatigue, nausea, vaginal discharge and vomiting   Associated symptoms: no chest pain, no constipation, no cough, no diarrhea, no dysuria, no fever, no hematemesis, no hematochezia, no hematuria, no shortness of breath and no vaginal bleeding     Patient presents with 4 day history of gradual onset, constant, generalized abdominal pain that becomes intermittently sharp. Patient points to her epigastric area when describing her pain. She reports decreased by mouth intake. She [facility] Percocet for her pain. It does not seem to be associated with food. No aggravating factors. She states her last BM was 6 days ago. She states before that she was experiencing diarrhea. She also complains of nausea and vomiting, last episode of nonbloody emesis yesterday. No urinary symptoms. No chest pain or shortness of breath. Denies alcohol use. She does smoke marijuana, last use Monday.  She was seen by her primary care doctor yesterday for this complaint. Labs were obtained and remarkable for a WBC of 18.8. She was advised to go to the emergency department, however she refused. She was treated with IM Phenergan and 1 g Rocephin.  Past Medical History  Diagnosis Date  . Asthma    Past Surgical History  Procedure Laterality Date  . Ovarian cyst removal     No family history on file. Social History  Substance Use Topics  . Smoking status: Current Every Day Smoker -- 1.00 packs/day    Types: Cigarettes  . Smokeless tobacco: Never Used  . Alcohol Use: 0.0 oz/week   OB History    No data available      Review of Systems  Constitutional: Positive for fatigue. Negative for fever.  Respiratory: Negative for cough and shortness of breath.   Cardiovascular: Negative for chest pain.  Gastrointestinal: Positive for nausea, vomiting, abdominal pain and anorexia. Negative for diarrhea, constipation, blood in stool, hematochezia and hematemesis.  Genitourinary: Positive for vaginal discharge. Negative for dysuria, hematuria and vaginal bleeding.  All other systems reviewed and are negative.     Allergies  Demerol  Home Medications   Prior to Admission medications   Medication Sig Start Date End Date Taking? Authorizing Provider  albuterol (PROVENTIL HFA;VENTOLIN HFA) 108 (90 BASE) MCG/ACT inhaler Inhale 2 puffs into the lungs every 4 (four) hours as needed for wheezing or shortness of breath (cough). 12/29/12   Wayland SalinasJohn Bednar, MD  HYDROcodone-acetaminophen (NORCO) 5-325 MG per tablet Take 2 tablets by mouth every 4 (four) hours as needed for pain. 03/22/13   Marissa Sciacca, PA-C  ibuprofen (ADVIL,MOTRIN) 200 MG tablet Take 200 mg by mouth every 6 (six) hours as needed for pain.    Historical Provider, MD  ibuprofen (ADVIL,MOTRIN) 600 MG tablet Take 1 tablet (600 mg total) by mouth every 6 (six) hours as needed for pain. 03/22/13   Marissa Sciacca, PA-C  methocarbamol (ROBAXIN) 500 MG tablet Take 1 tablet (500 mg total) by mouth 2 (two) times daily. 03/09/13   April Palumbo, MD  metoCLOPramide (REGLAN) 10 MG tablet Take 1 tablet (10 mg total) by mouth every 6 (six) hours as needed (nausea/headache). 12/29/12   Wayland SalinasJohn Bednar, MD  metroNIDAZOLE (  FLAGYL) 500 MG tablet Take 1 tablet (500 mg total) by mouth 2 (two) times daily. One po bid x 7 days 11/07/13   Paula Libra, MD  ondansetron (ZOFRAN ODT) 8 MG disintegrating tablet  ODT q4 hours prn nausea 12/29/12   Wayland Salinas, MD  traMADol (ULTRAM) 50 MG tablet Take 1 tablet (50 mg total) by mouth every 6 (six) hours as needed for pain. 03/09/13   April Palumbo,  MD   BP 147/94 mmHg  Pulse 70  Temp(Src) 98.2 F (36.8 C) (Oral)  Resp 16  SpO2 99%  LMP 03/25/2016 Physical Exam  Constitutional: She is oriented to person, place, and time. She appears well-developed and well-nourished.  Non-toxic appearance. She does not have a sickly appearance. She does not appear ill.  HENT:  Head: Normocephalic and atraumatic.  Mouth/Throat: Oropharynx is clear and moist.  Eyes: Conjunctivae are normal. Pupils are equal, round, and reactive to light.  Neck: Normal range of motion. Neck supple.  Cardiovascular: Normal rate, regular rhythm and normal heart sounds.   No murmur heard. Pulmonary/Chest: Effort normal and breath sounds normal. No accessory muscle usage or stridor. No respiratory distress. She has no wheezes. She has no rhonchi. She has no rales.  Abdominal: Soft. Bowel sounds are normal. She exhibits no distension. There is generalized tenderness. There is no rigidity, no rebound, no guarding and no CVA tenderness.  No peritoneal signs.  Genitourinary: Vagina normal and uterus normal. There is no tenderness on the right labia. There is no tenderness on the left labia. Cervix exhibits no motion tenderness and no discharge. Right adnexum displays no mass and no tenderness. Left adnexum displays no mass and no tenderness.  Musculoskeletal: Normal range of motion.  Lymphadenopathy:    She has no cervical adenopathy.  Neurological: She is alert and oriented to person, place, and time.  Speech clear without dysarthria.  Skin: Skin is warm and dry.  Psychiatric: She has a normal mood and affect. Her behavior is normal.    ED Course  Procedures (including critical care time) Labs Review Labs Reviewed  WET PREP, GENITAL - Abnormal; Notable for the following:    WBC, Wet Prep HPF POC MANY (*)    All other components within normal limits  URINALYSIS, ROUTINE W REFLEX MICROSCOPIC (NOT AT [facility]) - Abnormal; Notable for the following:    Color, Urine ORANGE  (*)    APPearance CLOUDY (*)    Specific Gravity, Urine 1.031 (*)    Hgb urine dipstick MODERATE (*)    Bilirubin Urine SMALL (*)    Ketones, ur 40 (*)    Protein, ur 30 (*)    Leukocytes, UA SMALL (*)    All other components within normal limits  URINE MICROSCOPIC-ADD ON - Abnormal; Notable for the following:    Squamous Epithelial / LPF 6-30 (*)    Bacteria, UA MANY (*)    All other components within normal limits  COMPREHENSIVE METABOLIC PANEL - Abnormal; Notable for the following:    Potassium 3.2 (*)    Glucose, Bld 101 (*)    Calcium 8.8 (*)    ALT 13 (*)    Total Bilirubin 2.4 (*)    All other components within normal limits  CBC WITH DIFFERENTIAL/PLATELET - Abnormal; Notable for the following:    RBC 3.79 (*)    HCT 35.8 (*)    RDW 10.9 (*)    All other components within normal limits  URINE CULTURE  PREGNANCY, URINE  LIPASE, BLOOD  RPR  HIV ANTIBODY (ROUTINE TESTING)  GC/CHLAMYDIA PROBE AMP ([place]) NOT AT [facility]    Imaging Review Ct Abdomen Pelvis W Contrast  03/29/2016  CLINICAL DATA:  Lower abdominal pain for 3 days EXAM: CT ABDOMEN AND PELVIS WITH CONTRAST TECHNIQUE: Multidetector CT imaging of the abdomen and pelvis was performed using the standard protocol following bolus administration of intravenous contrast. CONTRAST:  ISOVUE-300 IOPAMIDOL (ISOVUE-300) INJECTION 61% COMPARISON:  None. FINDINGS: Lung bases are free of acute infiltrate or sizable effusion. The liver, gallbladder, spleen, adrenal glands and pancreas are within normal limits. The kidneys are well visualized bilaterally. No renal calculi or obstructive changes are seen. The bladder is partially distended. The uterus and ovaries appear within normal limits. Visualized bowel appears within normal limits. The appendix is not well seen although no inflammatory changes to suggest appendicitis are noted. No significant aortic abnormality is noted. The osseous structures show no acute abnormality.  IMPRESSION: No acute abnormality seen. Electronically Signed   By: Alcide Clever M.D.   On: 03/29/2016 13:49   I have personally reviewed and evaluated these images and lab results as part of my medical decision-making.   EKG Interpretation None      MDM   Final diagnoses:  UTI (lower urinary tract infection)   Patient presents with abdominal pain. Afebrile, she appears well, nontoxic or septic appearing. Seen yesterday by her PCP, noted to have an elevated white count. She received 1 g of Rocephin. On exam, diffuse abdominal tenderness without rebound, guarding, or peritoneal signs. No CVA tenderness. No cervical motion tenderness or adnexal tenderness. Low suspicion for surgical abdomen.  Labs show K 3.2, given PO K.  WBC 9.1.  UA shows small leukocytes, 6-30 WBCs, and many bacteria.  Wet prep unremarkable. CT abdomen/pelvis without acute abnormalities. Patient reports improvement of pain with Morphine.  Repeat abdominal exam reassuring.  Able to tolerate PO without difficulty. Plan to discharge home with Bactrim BID x 14 days.  Case has been discussed with and seen by Dr. Ethelda Chick who agrees with the above plan for discharge.     Cheri Fowler, PA-C 03/29/16 1528  Doug Sou, MD 03/29/16 [phone]

## 2016-03-29 NOTE — ED Notes (Signed)
Abdominal pain from her epigastric area to her lower abdomen x 3 days. Vomiting and diarrhea. She was seen by her MD yesterday and had an elevated WBC.

## 2016-03-29 NOTE — ED Provider Notes (Addendum)
Complains of diffuse abdominal pain radiating to both flanks onset 3 days ago. Treated with Goody powder and BC powder, without relief Gradually. Pain is constant however she has exacerbations which last one or 2 minutes at a time Pain started at lower abdomen has since moved diffusely to her entire abdomen. Pain is worse with walking improved with remaining still. Denies fever. She admits to vomiting last time yesterday, However she is presently hungry. On exam no distress lungs clear auscultation heart regular rate and rhythm abdomen nondistended normal active bowel sounds diffuse tenderness no guarding rigidity or rebound.  Doug SouSam Randa Riss, MD 03/29/16 1300  Doug SouSam Cleatus Gabriel, MD 03/29/16 1302

## 2016-03-29 NOTE — ED Notes (Signed)
Patient c/o low abd pain, that radiates into her legs, also flank pain. Patient was seen at PCP on Monday and Wednesday, had blood work down, results in Care Everywhere, no UA completed. Patient denies pain with urination, but states it feels like a lot of pressure. Patient denies an increase in frequency. Patient reports diarrhea when eating last week, states this is mostly gone, and now she has nausea and vomiting.

## 2016-03-30 LAB — GC/CHLAMYDIA PROBE AMP (~~LOC~~) NOT AT ARMC
CHLAMYDIA, DNA PROBE: NEGATIVE
Neisseria Gonorrhea: POSITIVE — AB

## 2016-03-30 LAB — RPR: RPR: NONREACTIVE

## 2016-03-30 LAB — URINE CULTURE: Culture: 1000 — AB

## 2016-03-30 LAB — HIV ANTIBODY (ROUTINE TESTING W REFLEX): HIV SCREEN 4TH GENERATION: NONREACTIVE

## 2016-04-02 ENCOUNTER — Telehealth (HOSPITAL_BASED_OUTPATIENT_CLINIC_OR_DEPARTMENT_OTHER): Payer: Self-pay | Admitting: Emergency Medicine

## 2016-04-02 NOTE — Telephone Encounter (Signed)
Chart handoff to EDP for treatment plan for gonorrhea 

## 2016-04-03 ENCOUNTER — Telehealth: Payer: Self-pay | Admitting: *Deleted

## 2016-06-16 ENCOUNTER — Encounter (HOSPITAL_BASED_OUTPATIENT_CLINIC_OR_DEPARTMENT_OTHER): Payer: Self-pay | Admitting: Emergency Medicine

## 2016-06-16 DIAGNOSIS — Z7951 Long term (current) use of inhaled steroids: Secondary | ICD-10-CM | POA: Diagnosis not present

## 2016-06-16 DIAGNOSIS — R110 Nausea: Secondary | ICD-10-CM | POA: Diagnosis not present

## 2016-06-16 DIAGNOSIS — F17210 Nicotine dependence, cigarettes, uncomplicated: Secondary | ICD-10-CM | POA: Insufficient documentation

## 2016-06-16 DIAGNOSIS — Z3201 Encounter for pregnancy test, result positive: Secondary | ICD-10-CM | POA: Diagnosis not present

## 2016-06-16 DIAGNOSIS — R102 Pelvic and perineal pain: Secondary | ICD-10-CM | POA: Diagnosis present

## 2016-06-16 DIAGNOSIS — O26891 Other specified pregnancy related conditions, first trimester: Secondary | ICD-10-CM | POA: Diagnosis not present

## 2016-06-16 DIAGNOSIS — R1084 Generalized abdominal pain: Secondary | ICD-10-CM | POA: Insufficient documentation

## 2016-06-16 DIAGNOSIS — Z3A01 Less than 8 weeks gestation of pregnancy: Secondary | ICD-10-CM | POA: Diagnosis not present

## 2016-06-16 DIAGNOSIS — J45909 Unspecified asthma, uncomplicated: Secondary | ICD-10-CM | POA: Insufficient documentation

## 2016-06-16 NOTE — ED Notes (Addendum)
Patient states "I'm having pain" Patient then expands to say she is having generalized abd pain, with some back pain, that she can't stand up for very long and that she feels SOB. Patient states she was diagnosed with ulcers and was started on a medication for two weeks that resolved her symptoms. She is no longer taking the medication, and her pain has returned. Patient states the pain is not the same as before. Patient is speaking in complete sentences, NAD noted. Patient also reports intermittent nausea, denies vomiting. Patient also c/o pelvic discomfort, denies any discharge.

## 2016-06-17 ENCOUNTER — Emergency Department (HOSPITAL_BASED_OUTPATIENT_CLINIC_OR_DEPARTMENT_OTHER)
Admission: EM | Admit: 2016-06-17 | Discharge: 2016-06-17 | Disposition: A | Discharge status: Home / Self Care | Payer: Medicaid Other | Attending: Emergency Medicine | Admitting: Emergency Medicine

## 2016-06-17 DIAGNOSIS — Z3490 Encounter for supervision of normal pregnancy, unspecified, unspecified trimester: Secondary | ICD-10-CM

## 2016-06-17 LAB — HCG, QUANTITATIVE, PREGNANCY: hCG, Beta Chain, Quant, S: 148619 m[IU]/mL — ABNORMAL HIGH (ref <5)

## 2016-06-17 LAB — URINALYSIS, ROUTINE W REFLEX MICROSCOPIC
Bilirubin Urine: NEGATIVE
Glucose, UA: NEGATIVE mg/dL
HGB URINE DIPSTICK: NEGATIVE
Ketones, ur: NEGATIVE mg/dL
Leukocytes, UA: NEGATIVE
Nitrite: NEGATIVE
PH: 7 (ref 5.0–8.0)
Protein, ur: NEGATIVE mg/dL
SPECIFIC GRAVITY, URINE: 1.024 (ref 1.005–1.030)

## 2016-06-17 LAB — COMPREHENSIVE METABOLIC PANEL
ALK PHOS: 102 U/L (ref 38–126)
ALT: 17 U/L (ref 14–54)
ANION GAP: 7 (ref 5–15)
AST: 15 U/L (ref 15–41)
Albumin: 4.2 g/dL (ref 3.5–5.0)
BILIRUBIN TOTAL: 1.3 mg/dL — AB (ref 0.3–1.2)
BUN: 10 mg/dL (ref 6–20)
CO2: 26 mmol/L (ref 22–32)
Calcium: 9 mg/dL (ref 8.9–10.3)
Chloride: 103 mmol/L (ref 101–111)
Creatinine, Ser: 0.61 mg/dL (ref 0.44–1.00)
Glucose, Bld: 92 mg/dL (ref 65–99)
Potassium: 3.5 mmol/L (ref 3.5–5.1)
SODIUM: 136 mmol/L (ref 135–145)
Total Protein: 7.1 g/dL (ref 6.5–8.1)

## 2016-06-17 LAB — CBC
HCT: 34.1 % — ABNORMAL LOW (ref 36.0–46.0)
HEMOGLOBIN: 12.1 g/dL (ref 12.0–15.0)
MCH: 34.3 pg — AB (ref 26.0–34.0)
MCHC: 35.5 g/dL (ref 30.0–36.0)
MCV: 96.6 fL (ref 78.0–100.0)
Platelets: 263 10*3/uL (ref 150–400)
RBC: 3.53 MIL/uL — AB (ref 3.87–5.11)
RDW: 11.2 % — ABNORMAL LOW (ref 11.5–15.5)
WBC: 7.6 10*3/uL (ref 4.0–10.5)

## 2016-06-17 LAB — LIPASE, BLOOD: Lipase: 36 U/L (ref 11–51)

## 2016-06-17 LAB — PREGNANCY, URINE: PREG TEST UR: POSITIVE — AB

## 2016-06-17 MED ORDER — FAMOTIDINE 20 MG PO TABS
20.0000 mg | ORAL_TABLET | Freq: Once | ORAL | Status: AC
  Administered 2016-06-17: 20 mg via ORAL
  Filled 2016-06-17: qty 1

## 2016-06-17 MED ORDER — ACETAMINOPHEN 500 MG PO TABS
1000.0000 mg | ORAL_TABLET | Freq: Once | ORAL | Status: AC
  Administered 2016-06-17: 1000 mg via ORAL
  Filled 2016-06-17: qty 2

## 2016-06-17 MED ORDER — PRENATAL VITAMIN 27-0.8 MG PO TABS: 1.0000 | ORAL_TABLET | Freq: Every day | ORAL | Status: DC

## 2016-06-17 NOTE — Discharge Instructions (Signed)
Eating Plan for Pregnant Women While you are pregnant, your body will require additional nutrition to help support your growing baby. It is recommended that you consume:  150 additional calories each day during your first trimester.  300 additional calories each day during your second trimester.  300 additional calories each day during your third trimester. Eating a healthy, well-balanced diet is very important for your health and for your baby's health. You also have a higher need for some vitamins and minerals, such as folic acid, calcium, iron, and vitamin D. WHAT DO I NEED TO KNOW ABOUT EATING DURING PREGNANCY?  Do not try to lose weight or go on a diet during pregnancy.  Choose healthy, nutritious foods. Choose  of a sandwich with a glass of milk instead of a candy bar or a high-calorie sugar-sweetened beverage.  Limit your overall intake of foods that have "empty calories." These are foods that have Gangwer nutritional value, such as sweets, desserts, candies, sugar-sweetened beverages, and fried foods.  Eat a variety of foods, especially fruits and vegetables.  Take a prenatal vitamin to help meet the additional needs during pregnancy, specifically for folic acid, iron, calcium, and vitamin D.  Remember to stay active. Ask your health care provider for exercise recommendations that are specific to you.  Practice good food safety and cleanliness, such as washing your hands before you eat and after you prepare raw meat. This helps to prevent foodborne illnesses, such as listeriosis, that can be very dangerous for your baby. Ask your health care provider for more information about listeriosis. WHAT DOES 150 EXTRA CALORIES LOOK LIKE? Healthy options for an additional 150 calories each day could be any of the following:  Plain low-fat yogurt (6-8 oz) with  cup of berries.  1 apple with 2 teaspoons of peanut butter.  Cut-up vegetables with  cup of hummus.  Low-fat chocolate milk  (8 oz or 1 cup).  1 string cheese with 1 medium orange.   of a peanut butter and jelly sandwich on whole-wheat bread (1 tsp of peanut butter). For 300 calories, you could eat two of those healthy options each day.  WHAT IS A HEALTHY AMOUNT OF WEIGHT TO GAIN? The recommended amount of weight for you to gain is based on your pre-pregnancy BMI. If your pre-pregnancy BMI was:  Less than 18 (underweight), you should gain 28-40 lb.  18-24.9 (normal), you should gain 25-35 lb.  25-29.9 (overweight), you should gain 15-25 lb.  Greater than 30 (obese), you should gain 11-20 lb. WHAT IF I AM HAVING TWINS OR MULTIPLES? Generally, pregnant women who will be having twins or multiples may need to increase their daily calories by 300-600 calories each day. The recommended range for total weight gain is 25-54 lb, depending on your pre-pregnancy BMI. Talk with your health care provider for specific guidance about additional nutritional needs, weight gain, and exercise during your pregnancy. WHAT FOODS CAN I EAT? Grains Any grains. Try to choose whole grains, such as whole-wheat bread, oatmeal, or brown rice. Vegetables Any vegetables. Try to eat a variety of colors and types of vegetables to get a full range of vitamins and minerals. Remember to wash your vegetables well before eating. Fruits Any fruits. Try to eat a variety of colors and types of fruit to get a full range of vitamins and minerals. Remember to wash your fruits well before eating. Meats and Other Protein Sources Lean meats, including chicken, [place], fish, and lean cuts of beef, veal, or pork.  Make sure that all meats are cooked to "well done." Tofu. Tempeh. Beans. Eggs. Peanut butter and other nut butters. Seafood, such as shrimp, crab, and lobster. If you choose fish, select types that are higher in omega-3 fatty acids, including salmon, herring, mussels, trout, sardines, and pollock. Make sure that all meats are cooked to food-safe  temperatures. Dairy Pasteurized milk and milk alternatives. Pasteurized yogurt and pasteurized cheese. Cottage cheese. Sour cream. Beverages Water. Juices that contain 100% fruit juice or vegetable juice. Caffeine-free teas and decaffeinated coffee. Drinks that contain caffeine are okay to drink, but it is better to avoid caffeine. Keep your total caffeine intake to less than 200 mg each day (12 oz of coffee, tea, or soda) or as directed by your health care provider. Condiments Any pasteurized condiments. Sweets and Desserts Any sweets and desserts. Fats and Oils Any fats and oils. The items listed above may not be a complete list of recommended foods or beverages. Contact your dietitian for more options. WHAT FOODS ARE NOT RECOMMENDED? Vegetables Unpasteurized (raw) vegetable juices. Fruits Unpasteurized (raw) fruit juices. Meats and Other Protein Sources Cured meats that have nitrates, such as bacon, salami, and hotdogs. Luncheon meats, bologna, or other deli meats (unless they are reheated until they are steaming hot). Refrigerated pate, meat spreads from a meat counter, smoked seafood that is found in the refrigerated section of a store. Raw fish, such as sushi or sashimi. High mercury content fish, such as tilefish, shark, swordfish, and king mackerel. Raw meats, such as tuna or beef tartare. Undercooked meats and poultry. Make sure that all meats are cooked to food-safe temperatures. Dairy Unpasteurized (raw) milk and any foods that have raw milk in them. Soft cheeses, such as feta, queso blanco, queso fresco, Brie, Camembert cheeses, blue-veined cheeses, and Panela cheese (unless it is made with pasteurized milk, which must be stated on the label). Beverages Alcohol. Sugar-sweetened beverages, such as sodas, teas, or energy drinks. Condiments Homemade fermented foods and drinks, such as pickles, sauerkraut, or kombucha drinks. (Store-bought pasteurized versions of these are  okay.) Other Salads that are made in the store, such as ham salad, chicken salad, egg salad, tuna salad, and seafood salad. The items listed above may not be a complete list of foods and beverages to avoid. Contact your dietitian for more information.   This information is not intended to replace advice given to you by your health care provider. Make sure you discuss any questions you have with your health care provider.   Document Released: 09/24/2014 Document Reviewed: 09/24/2014 Elsevier Interactive Patient Education [employer]  First Trimester of Pregnancy The first trimester of pregnancy is from week 1 until the end of week 12 (months 1 through 3). A week after a sperm fertilizes an egg, the egg will implant on the wall of the uterus. This embryo will begin to develop into a baby. Genes from you and your partner are forming the baby. The female genes determine whether the baby is a boy or a girl. At 6-8 weeks, the eyes and face are formed, and the heartbeat can be seen on ultrasound. At the end of 12 weeks, all the baby's organs are formed.  Now that you are pregnant, you will want to do everything you can to have a healthy baby. Two of the most important things are to get good prenatal care and to follow your health care provider's instructions. Prenatal care is all the medical care you receive before the baby's birth. This  care will help prevent, find, and treat any problems during the pregnancy and childbirth. BODY CHANGES Your body goes through many changes during pregnancy. The changes vary from woman to woman.   You may gain or lose a couple of pounds at first.  You may feel sick to your stomach (nauseous) and throw up (vomit). If the vomiting is uncontrollable, call your health care provider.  You may tire easily.  You may develop headaches that can be relieved by medicines approved by your health care provider.  You may urinate more often. Painful urination may mean you  have a bladder infection.  You may develop heartburn as a result of your pregnancy.  You may develop constipation because certain hormones are causing the muscles that push waste through your intestines to slow down.  You may develop hemorrhoids or swollen, bulging veins (varicose veins).  Your breasts may begin to grow larger and become tender. Your nipples may stick out more, and the tissue that surrounds them (areola) may become darker.  Your gums may bleed and may be sensitive to brushing and flossing.  Dark spots or blotches (chloasma, mask of pregnancy) may develop on your face. This will likely fade after the baby is born.  Your menstrual periods will stop.  You may have a loss of appetite.  You may develop cravings for certain kinds of food.  You may have changes in your emotions from day to day, such as being excited to be pregnant or being concerned that something may go wrong with the pregnancy and baby.  You may have more vivid and strange dreams.  You may have changes in your hair. These can include thickening of your hair, rapid growth, and changes in texture. Some women also have hair loss during or after pregnancy, or hair that feels dry or thin. Your hair will most likely return to normal after your baby is born. WHAT TO EXPECT AT YOUR PRENATAL VISITS During a routine prenatal visit:  You will be weighed to make sure you and the baby are growing normally.  Your blood pressure will be taken.  Your abdomen will be measured to track your baby's growth.  The fetal heartbeat will be listened to starting around week 10 or 12 of your pregnancy.  Test results from any previous visits will be discussed. Your health care provider may ask you:  How you are feeling.  If you are feeling the baby move.  If you have had any abnormal symptoms, such as leaking fluid, bleeding, severe headaches, or abdominal cramping.  If you are using any tobacco products, including  cigarettes, chewing tobacco, and electronic cigarettes.  If you have any questions. Other tests that may be performed during your first trimester include:  Blood tests to find your blood type and to check for the presence of any previous infections. They will also be used to check for low iron levels (anemia) and Rh antibodies. Later in the pregnancy, blood tests for diabetes will be done along with other tests if problems develop.  Urine tests to check for infections, diabetes, or protein in the urine.  An ultrasound to confirm the proper growth and development of the baby.  An amniocentesis to check for possible genetic problems.  Fetal screens for spina bifida and Down syndrome.  You may need other tests to make sure you and the baby are doing well.  HIV (human immunodeficiency virus) testing. Routine prenatal testing includes screening for HIV, unless you choose not to have  this test. HOME CARE INSTRUCTIONS  Medicines  Follow your health care provider's instructions regarding medicine use. Specific medicines may be either safe or unsafe to take during pregnancy.  Take your prenatal vitamins as directed.  If you develop constipation, try taking a stool softener if your health care provider approves. Diet  Eat regular, well-balanced meals. Choose a variety of foods, such as meat or vegetable-based protein, fish, milk and low-fat dairy products, vegetables, fruits, and whole grain breads and cereals. Your health care provider will help you determine the amount of weight gain that is right for you.  Avoid raw meat and uncooked cheese. These carry germs that can cause birth defects in the baby.  Eating four or five small meals rather than three large meals a day may help relieve nausea and vomiting. If you start to feel nauseous, eating a few soda crackers can be helpful. Drinking liquids between meals instead of during meals also seems to help nausea and vomiting.  If you develop  constipation, eat more high-fiber foods, such as fresh vegetables or fruit and whole grains. Drink enough fluids to keep your urine clear or pale yellow. Activity and Exercise  Exercise only as directed by your health care provider. Exercising will help you:  Control your weight.  Stay in shape.  Be prepared for labor and delivery.  Experiencing pain or cramping in the lower abdomen or low back is a good sign that you should stop exercising. Check with your health care provider before continuing normal exercises.  Try to avoid standing for long periods of time. Move your legs often if you must stand in one place for a long time.  Avoid heavy lifting.  Wear low-heeled shoes, and practice good posture.  You may continue to have sex unless your health care provider directs you otherwise. Relief of Pain or Discomfort  Wear a good support bra for breast tenderness.   Take warm sitz baths to soothe any pain or discomfort caused by hemorrhoids. Use hemorrhoid cream if your health care provider approves.   Rest with your legs elevated if you have leg cramps or low back pain.  If you develop varicose veins in your legs, wear support hose. Elevate your feet for 15 minutes, 3-4 times a day. Limit salt in your diet. Prenatal Care  Schedule your prenatal visits by the twelfth week of pregnancy. They are usually scheduled monthly at first, then more often in the last 2 months before delivery.  Write down your questions. Take them to your prenatal visits.  Keep all your prenatal visits as directed by your health care provider. Safety  Wear your seat belt at all times when driving.  Make a list of emergency phone numbers, including numbers for family, friends, the hospital, and police and fire departments. General Tips  Ask your health care provider for a referral to a local prenatal education class. Begin classes no later than at the beginning of month 6 of your pregnancy.  Ask for  help if you have counseling or nutritional needs during pregnancy. Your health care provider can offer advice or refer you to specialists for help with various needs.  Do not use hot tubs, steam rooms, or saunas.  Do not douche or use tampons or scented sanitary pads.  Do not cross your legs for long periods of time.  Avoid cat litter boxes and soil used by cats. These carry germs that can cause birth defects in the baby and possibly loss of the fetus  by miscarriage or stillbirth.  Avoid all smoking, herbs, alcohol, and medicines not prescribed by your health care provider. Chemicals in these affect the formation and growth of the baby.  Do not use any tobacco products, including cigarettes, chewing tobacco, and electronic cigarettes. If you need help quitting, ask your health care provider. You may receive counseling support and other resources to help you quit.  Schedule a dentist appointment. At home, brush your teeth with a soft toothbrush and be gentle when you floss. SEEK MEDICAL CARE IF:   You have dizziness.  You have mild pelvic cramps, pelvic pressure, or nagging pain in the abdominal area.  You have persistent nausea, vomiting, or diarrhea.  You have a bad smelling vaginal discharge.  You have pain with urination.  You notice increased swelling in your face, hands, legs, or ankles. SEEK IMMEDIATE MEDICAL CARE IF:   You have a fever.  You are leaking fluid from your vagina.  You have spotting or bleeding from your vagina.  You have severe abdominal cramping or pain.  You have rapid weight gain or loss.  You vomit blood or material that looks like coffee grounds.  You are exposed to [place] measles and have never had them.  You are exposed to fifth disease or chickenpox.  You develop a severe headache.  You have shortness of breath.  You have any kind of trauma, such as from a fall or a car accident.   This information is not intended to replace advice  given to you by your health care provider. Make sure you discuss any questions you have with your health care provider.   Document Released: 12/04/2001 Document Revised: 12/31/2014 Document Reviewed: 10/20/2013 Elsevier Interactive Patient Education [employer]

## 2016-06-18 ENCOUNTER — Encounter (HOSPITAL_BASED_OUTPATIENT_CLINIC_OR_DEPARTMENT_OTHER): Payer: Self-pay | Admitting: Emergency Medicine

## 2016-06-18 NOTE — ED Provider Notes (Signed)
CSN: [id number]     Arrival date & time 06/16/16  2312 History   First MD Initiated Contact with Patient 06/17/16 [phone]     Chief Complaint  Patient presents with  . Abdominal Pain     (Consider location/radiation/quality/duration/timing/severity/associated sxs/prior Treatment) Patient is a 30 y.o. female presenting with abdominal pain. The history is provided by the patient.  Abdominal Pain Pain location:  Generalized Pain quality: aching   Pain radiates to:  Does not radiate Pain severity:  Moderate Onset quality:  Gradual Timing:  Constant Progression:  Waxing and waning Relieved by:  Nothing Worsened by:  Nothing tried Ineffective treatments:  None tried Associated symptoms: fatigue and nausea   Associated symptoms: no anorexia, no shortness of breath, no vaginal bleeding, no vaginal discharge and no vomiting   Associated symptoms comment:  Breast tenderness Risk factors: pregnancy   Patient told nurse she thinks she has ulcers.  Informed by nurse she was pregnant.  Nurse reports patient did not know this but according to care everywhere has been seen for same and has a [place] within the past 2 days.  She has another appointment with OB upcoming  Past Medical History  Diagnosis Date  . Asthma    Past Surgical History  Procedure Laterality Date  . Ovarian cyst removal     History reviewed. No pertinent family history. Social History  Substance Use Topics  . Smoking status: Current Every Day Smoker -- 1.00 packs/day    Types: Cigarettes  . Smokeless tobacco: Never Used  . Alcohol Use: 0.0 oz/week     Comment: denies   OB History    No data available     Review of Systems  Constitutional: Positive for fatigue.  Respiratory: Negative for shortness of breath.   Gastrointestinal: Positive for nausea and abdominal pain. Negative for vomiting and anorexia.  Genitourinary: Negative for vaginal bleeding and vaginal discharge.  All other systems reviewed and are  negative.     Allergies  Demerol  Home Medications   Prior to Admission medications   Medication Sig Start Date End Date Taking? Authorizing Provider  albuterol (PROVENTIL HFA;VENTOLIN HFA) 108 (90 BASE) MCG/ACT inhaler Inhale 2 puffs into the lungs every 4 (four) hours as needed for wheezing or shortness of breath (cough). 12/29/12  Yes Wayland Salinas, MD  HYDROcodone-acetaminophen (NORCO) 5-325 MG per tablet Take 2 tablets by mouth every 4 (four) hours as needed for pain. 03/22/13   Marissa Sciacca, PA-C  ibuprofen (ADVIL,MOTRIN) 200 MG tablet Take 200 mg by mouth every 6 (six) hours as needed for pain.    Historical Provider, MD  ibuprofen (ADVIL,MOTRIN) 600 MG tablet Take 1 tablet (600 mg total) by mouth every 6 (six) hours as needed for pain. 03/22/13   Marissa Sciacca, PA-C  methocarbamol (ROBAXIN) 500 MG tablet Take 1 tablet (500 mg total) by mouth 2 (two) times daily. 03/09/13   Herbert Marken, MD  metoCLOPramide (REGLAN) 10 MG tablet Take 1 tablet (10 mg total) by mouth every 6 (six) hours as needed (nausea/headache). 12/29/12   Wayland Salinas, MD  metroNIDAZOLE (FLAGYL) 500 MG tablet Take 1 tablet (500 mg total) by mouth 2 (two) times daily. One po bid x 7 days 11/07/13   Paula Libra, MD  naproxen (NAPROSYN) 500 MG tablet Take 1 tablet (500 mg total) by mouth 2 (two) times daily. 03/29/16   Cheri Fowler, PA-C  ondansetron (ZOFRAN ODT) 8 MG disintegrating tablet  ODT q4 hours prn nausea 12/29/12   Wayland Salinas,  MD  Prenatal Vit-Fe Fumarate-FA (PRENATAL VITAMIN) 27-0.8 MG TABS Take 1 tablet by mouth daily. 06/17/16   Franklin Clapsaddle, MD  traMADol (ULTRAM) 50 MG tablet Take 1 tablet (50 mg total) by mouth every 6 (six) hours as needed for pain. 03/09/13   Kazimierz Springborn, MD   BP 108/61 mmHg  Pulse 73  Temp(Src) 98.5 F (36.9 C) (Oral)  Resp 20  Ht 5\' 3"  (1.6 m)  Wt 130 lb (58.968 kg)  BMI 23.03 kg/m2  SpO2 100%  LMP  (LMP Unknown) Physical Exam  Constitutional: She is oriented to person, place,  and time. She appears well-developed and well-nourished. No distress.  HENT:  Head: Normocephalic and atraumatic.  Mouth/Throat: Oropharynx is clear and moist.  Eyes: Conjunctivae are normal. Pupils are equal, round, and reactive to light.  Neck: Normal range of motion. Neck supple.  Cardiovascular: Normal rate, regular rhythm and intact distal pulses.   Pulmonary/Chest: Effort normal and breath sounds normal. No respiratory distress. She has no wheezes. She has no rales.  Abdominal: Soft. Bowel sounds are normal. There is no tenderness. There is no rebound and no guarding.  Uterus just above symphysis pubis  Musculoskeletal: Normal range of motion.  Neurological: She is alert and oriented to person, place, and time.  Skin: Skin is warm and dry.  Psychiatric: She has a normal mood and affect.    ED Course  Procedures (including critical care time) Labs Review Labs Reviewed  COMPREHENSIVE METABOLIC PANEL - Abnormal; Notable for the following:    Total Bilirubin 1.3 (*)    All other components within normal limits  CBC - Abnormal; Notable for the following:    RBC 3.53 (*)    HCT 34.1 (*)    MCH 34.3 (*)    RDW 11.2 (*)    All other components within normal limits  PREGNANCY, URINE - Abnormal; Notable for the following:    Preg Test, Ur POSITIVE (*)    All other components within normal limits  HCG, QUANTITATIVE, PREGNANCY - Abnormal; Notable for the following:    hCG, Beta Chain, [place] LongestQuant, S [id number] (*)    All other components within normal limits  LIPASE, BLOOD  URINALYSIS, ROUTINE W REFLEX MICROSCOPIC (NOT AT [facility])    Imaging Review No results found. I have personally reviewed and evaluated these images and lab results as part of my medical decision-making.   EKG Interpretation None      MDM   Final diagnoses:  Pregnancy   Filed Vitals:   06/17/16 0318 06/17/16 0457  BP: 117/60 108/61  Pulse: 73 73  Temp:    Resp: 18 20    Results for orders placed or  performed during the hospital encounter of 06/17/16  Lipase, blood  Result Value Ref Range   Lipase 36 11 - 51 U/L  Comprehensive metabolic panel  Result Value Ref Range   Sodium 136 135 - 145 mmol/L   Potassium 3.5 3.5 - 5.1 mmol/L   Chloride 103 101 - 111 mmol/L   CO2 26 22 - 32 mmol/L   Glucose, Bld 92 65 - 99 mg/dL   BUN 10 6 - 20 mg/dL   Creatinine, Ser 5.620.61 0.44 - 1.00 mg/dL   Calcium 9.0 8.9 - 13.010.3 mg/dL   Total Protein 7.1 6.5 - 8.1 g/dL   Albumin 4.2 3.5 - 5.0 g/dL   AST 15 15 - 41 U/L   ALT 17 14 - 54 U/L   Alkaline Phosphatase 102 38 - 126  U/L   Total Bilirubin 1.3 (H) 0.3 - 1.2 mg/dL   GFR calc non Af Amer >60 >60 mL/min   GFR calc Af Amer >60 >60 mL/min   Anion gap 7 5 - 15  CBC  Result Value Ref Range   WBC 7.6 4.0 - 10.5 K/uL   RBC 3.53 (L) 3.87 - 5.11 MIL/uL   Hemoglobin 12.1 12.0 - 15.0 g/dL   HCT 10.234.1 (L) 72.536.0 - 36.646.0 %   MCV 96.6 78.0 - 100.0 fL   MCH 34.3 (H) 26.0 - 34.0 pg   MCHC 35.5 30.0 - 36.0 g/dL   RDW 44.011.2 (L) 34.711.5 - 42.515.5 %   Platelets 263 150 - 400 K/uL  Urinalysis, Routine w reflex microscopic  Result Value Ref Range   Color, Urine YELLOW YELLOW   APPearance CLEAR CLEAR   Specific Gravity, Urine 1.024 1.005 - 1.030   pH 7.0 5.0 - 8.0   Glucose, UA NEGATIVE NEGATIVE mg/dL   Hgb urine dipstick NEGATIVE NEGATIVE   Bilirubin Urine NEGATIVE NEGATIVE   Ketones, ur NEGATIVE NEGATIVE mg/dL   Protein, ur NEGATIVE NEGATIVE mg/dL   Nitrite NEGATIVE NEGATIVE   Leukocytes, UA NEGATIVE NEGATIVE  Pregnancy, urine  Result Value Ref Range   Preg Test, Ur POSITIVE (A) NEGATIVE  hCG, quantitative, pregnancy  Result Value Ref Range   hCG, Beta Chain, Quant, S [id number] (H) <5 mIU/mL   No results found. Exam vitals and labs are benign and reassuring and I do not feel there is an indication for additional imaging in this patient in the first trimester  Sleeping in the room upon entrance and awake easily to verbal stimuli.  Abdomen is completely non tender  there are no signs of an acute abdomen.  Many of her symptoms are consistent pregnancy.   She has close follow up.  She is informed that the only medication she can take until cleared by her OB are tylenol and tums.  We will start prenatal vitamins and have her follow up.  Strict return precautions given.      Cy BlamerApril Enisa Runyan, MD 06/18/16 [phone]

## 2016-07-07 ENCOUNTER — Telehealth: Payer: Self-pay | Admitting: *Deleted

## 2016-07-07 NOTE — Telephone Encounter (Signed)
No response to phone or letter regarding notification of (+)GC.  Treated per MD protocol at ER visit;

## 2017-02-18 IMAGING — CT CT ABD-PELV W/ CM
2 of 4 series · 17 of 46 positions shown, 19 images · IV contrast (iopamidol)
Comparison: None.

CLINICAL DATA: Lower abdominal pain for 3 days

EXAM:
CT ABDOMEN AND PELVIS WITH CONTRAST
TECHNIQUE: Multidetector CT imaging of the abdomen and pelvis was performed
using the standard protocol following bolus administration of
intravenous contrast.
CONTRAST:  100mL MQINDU-PGG IOPAMIDOL (MQINDU-PGG) INJECTION 61%

[Series 2: axial st · axial · 0.64mm/px · z∈[-522,-128]mm · 14 of 87 slices shown, 16 images]
[im 4/87  soft-tissue]
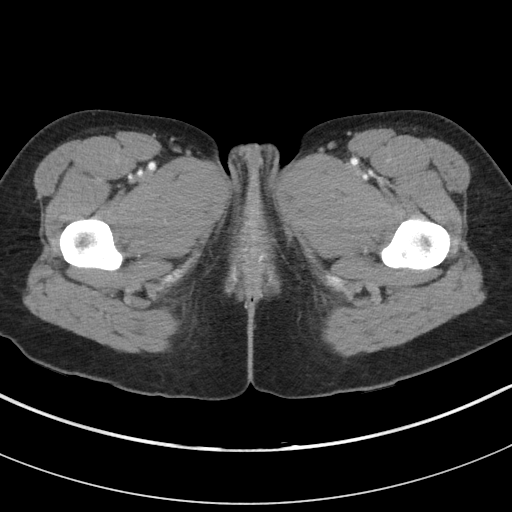
[im 4/87  bone]
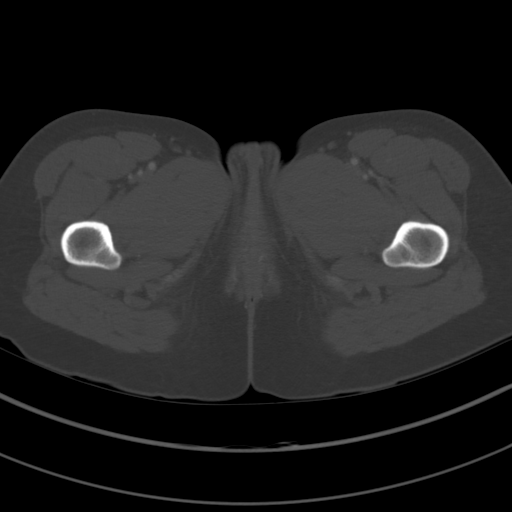
[im 10/87  soft-tissue]
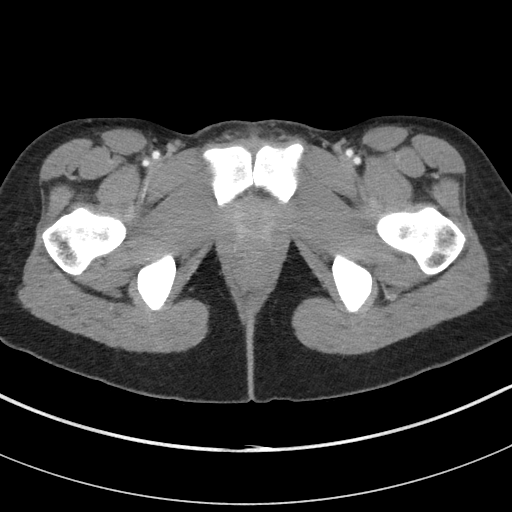
[im 17/87  soft-tissue]
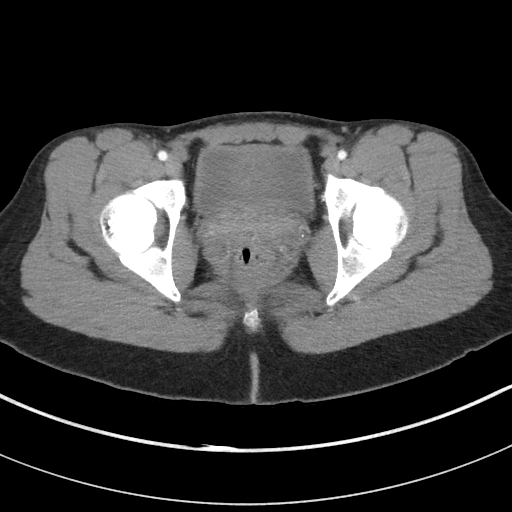
[im 24/87  soft-tissue]
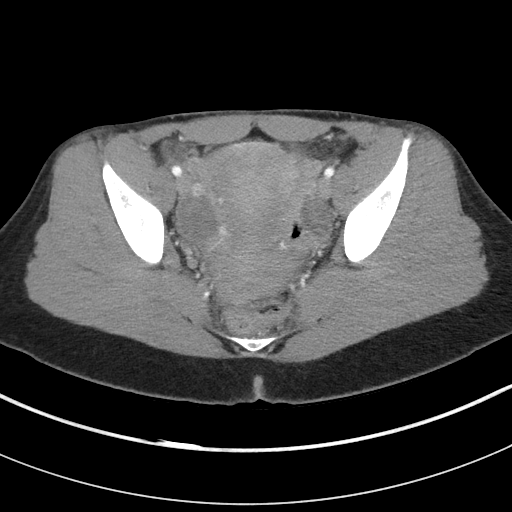
[im 30/87  soft-tissue]
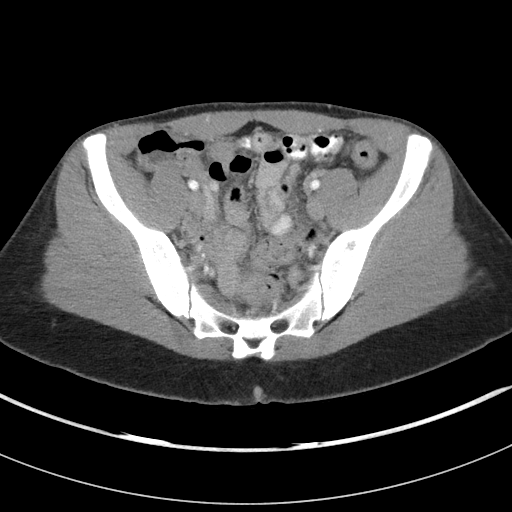
[im 34/87  soft-tissue]
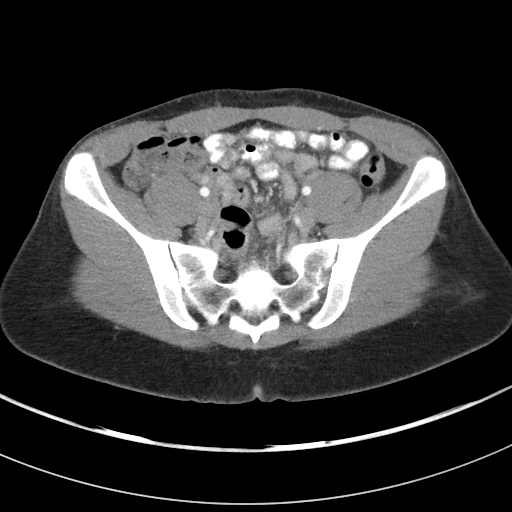
[im 40/87  soft-tissue]
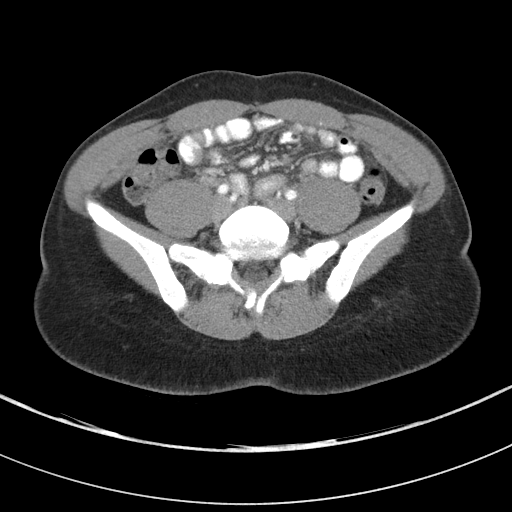
[im 47/87  soft-tissue]
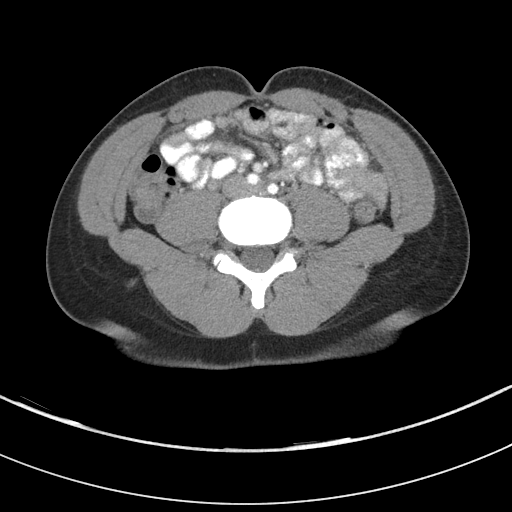
[im 53/87  soft-tissue]
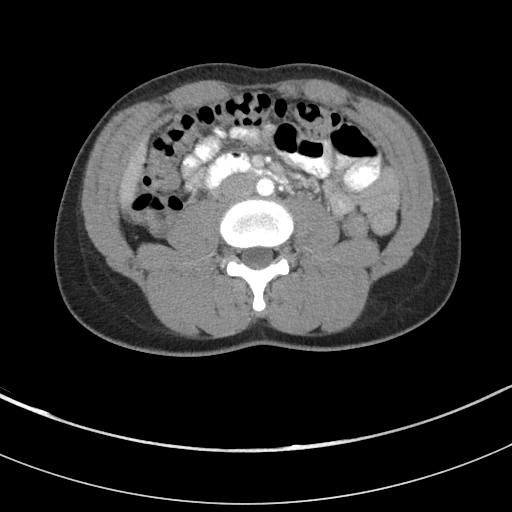
[im 53/87  bone]
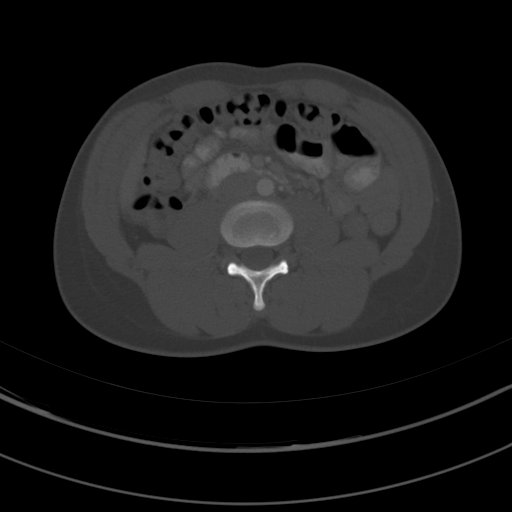
[im 57/87  soft-tissue]
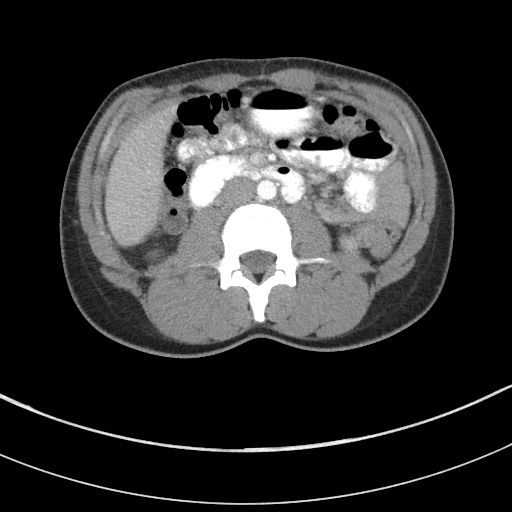
[im 63/87  soft-tissue]
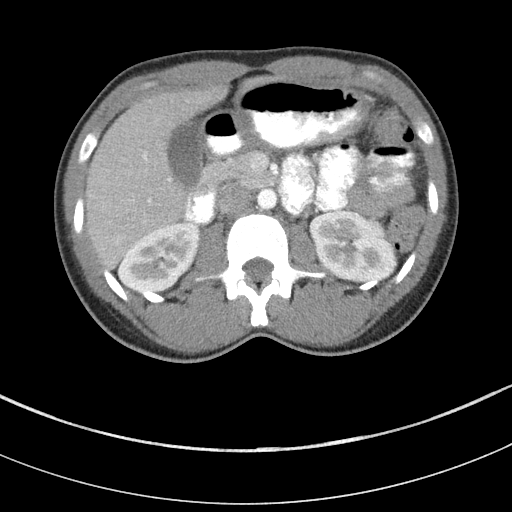
[im 70/87  soft-tissue]
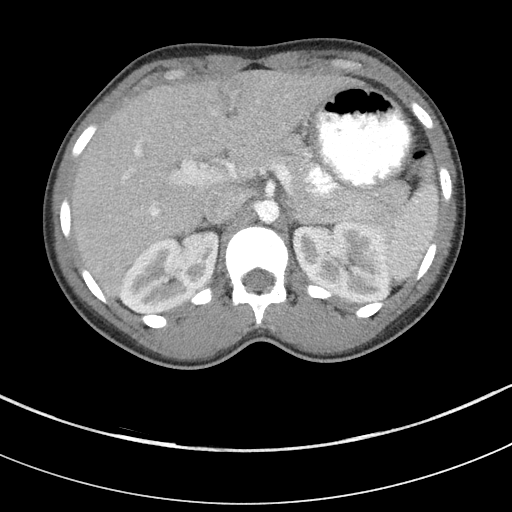
[im 77/87  soft-tissue]
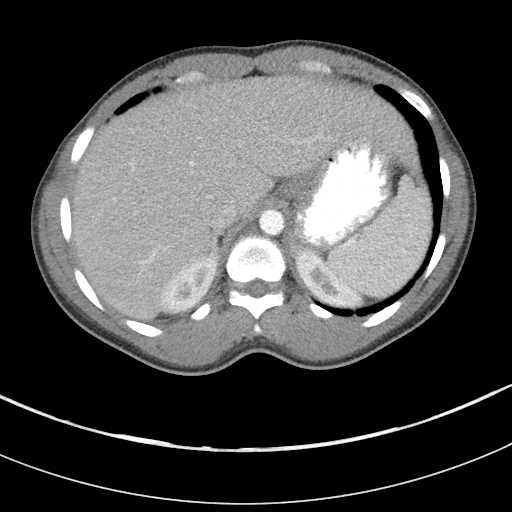
[im 83/87  soft-tissue]
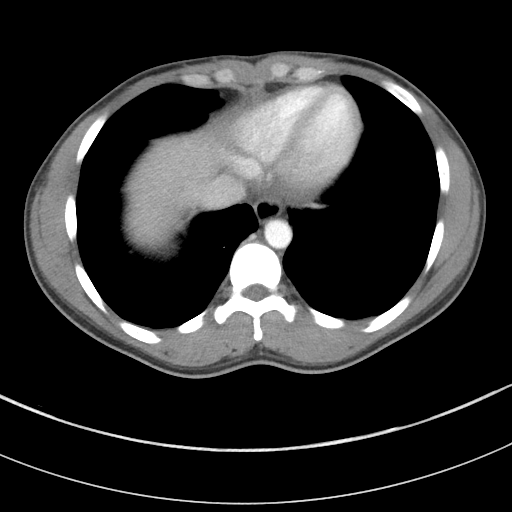

[Series 5: coronal st · coronal · 0.73mm/px · 3 of 73 slices shown]
[im 25/73  soft-tissue]
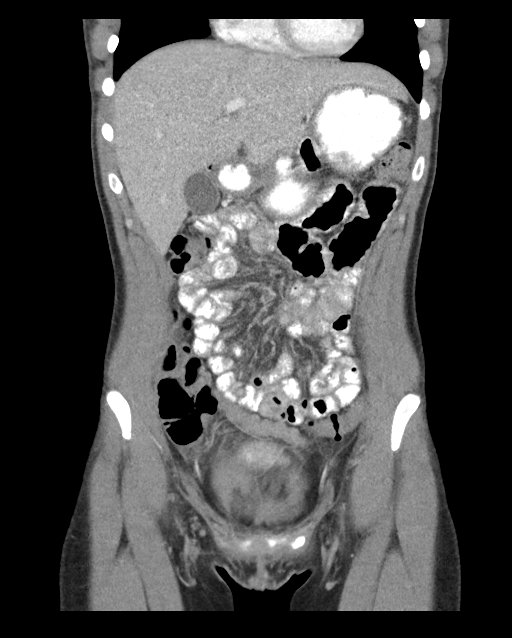
[im 33/73  soft-tissue]
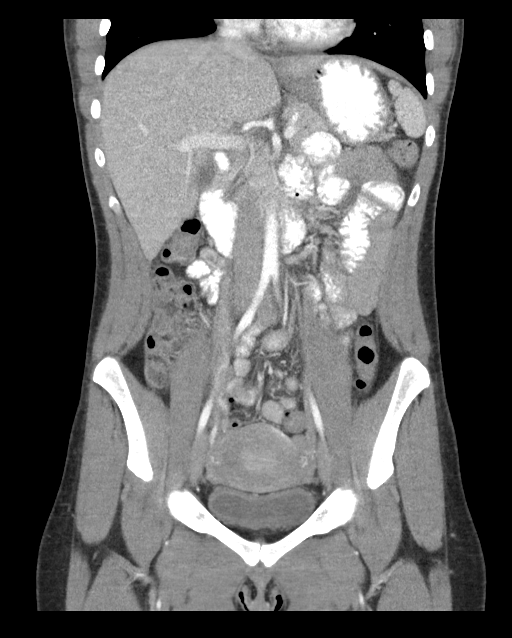
[im 41/73  soft-tissue]
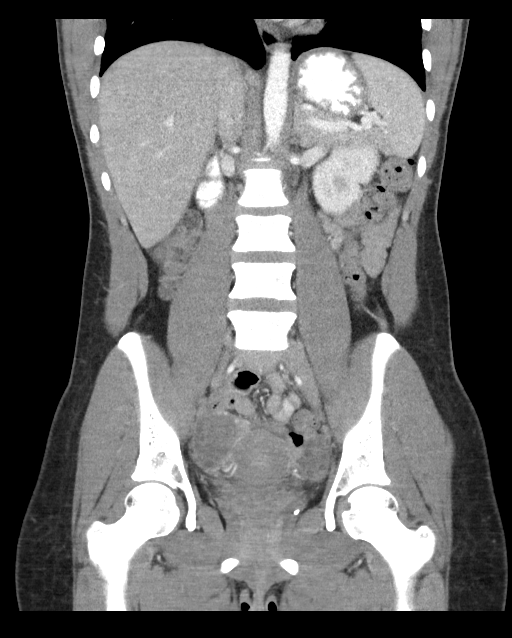

[17 of 46 positions shown; findings below may reference images not displayed]

FINDINGS: Lung bases are free of acute infiltrate or sizable effusion.

The liver, gallbladder, spleen, adrenal glands and pancreas are
within normal limits. The kidneys are well visualized bilaterally.
No renal calculi or obstructive changes are seen. The bladder is
partially distended. The uterus and ovaries appear within normal
limits.

Visualized bowel appears within normal limits. The appendix is not
well seen although no inflammatory changes to suggest appendicitis
are noted. No significant aortic abnormality is noted. The osseous
structures show no acute abnormality.
IMPRESSION: No acute abnormality seen.

## 2017-07-31 ENCOUNTER — Emergency Department (HOSPITAL_BASED_OUTPATIENT_CLINIC_OR_DEPARTMENT_OTHER)
Admission: EM | Admit: 2017-07-31 | Discharge: 2017-07-31 | Disposition: A | Discharge status: Home / Self Care | Payer: Medicaid Other | Attending: Emergency Medicine | Admitting: Emergency Medicine

## 2017-07-31 ENCOUNTER — Encounter (HOSPITAL_BASED_OUTPATIENT_CLINIC_OR_DEPARTMENT_OTHER): Payer: Self-pay

## 2017-07-31 DIAGNOSIS — F17210 Nicotine dependence, cigarettes, uncomplicated: Secondary | ICD-10-CM | POA: Insufficient documentation

## 2017-07-31 DIAGNOSIS — Z79899 Other long term (current) drug therapy: Secondary | ICD-10-CM | POA: Insufficient documentation

## 2017-07-31 DIAGNOSIS — J45909 Unspecified asthma, uncomplicated: Secondary | ICD-10-CM | POA: Insufficient documentation

## 2017-07-31 DIAGNOSIS — B9689 Other specified bacterial agents as the cause of diseases classified elsewhere: Secondary | ICD-10-CM

## 2017-07-31 DIAGNOSIS — N760 Acute vaginitis: Secondary | ICD-10-CM | POA: Insufficient documentation

## 2017-07-31 LAB — PREGNANCY, URINE: PREG TEST UR: NEGATIVE

## 2017-07-31 LAB — URINALYSIS, ROUTINE W REFLEX MICROSCOPIC
Bilirubin Urine: NEGATIVE
Glucose, UA: NEGATIVE mg/dL
Hgb urine dipstick: NEGATIVE
Ketones, ur: NEGATIVE mg/dL
LEUKOCYTES UA: NEGATIVE
NITRITE: NEGATIVE
Protein, ur: NEGATIVE mg/dL
SPECIFIC GRAVITY, URINE: 1.027 (ref 1.005–1.030)
pH: 6 (ref 5.0–8.0)

## 2017-07-31 LAB — WET PREP, GENITAL
Sperm: NONE SEEN
Trich, Wet Prep: NONE SEEN
YEAST WET PREP: NONE SEEN

## 2017-07-31 MED ORDER — METRONIDAZOLE 500 MG PO TABS: 500.0000 mg | ORAL_TABLET | Freq: Two times a day (BID) | ORAL | 0 refills | Status: AC

## 2017-07-31 NOTE — ED Notes (Signed)
ED Provider at bedside. 

## 2017-07-31 NOTE — ED Provider Notes (Signed)
[facility]   CSN: [id number] Arrival date & time: 07/31/17  2023     History   Chief Complaint Chief Complaint  Patient presents with  . Vaginal Discharge    HPI Meagan Sellers is a 31 y.o. female.  Patient is a 31yo female here with c/o vaginal discharge. States had had yellowish vaginal discharge for past 1 week, no itching/burning, no bleeding. No urinary symptoms. No known STD exposure. Otherwise feels well. Recently got out of prison and has no PCP, is searching for one because was recently started on BP medication while she was incarcerated but she has not been able to pick it up before she was released a few weeks ago.        Past Medical History:  Diagnosis Date  . Asthma     There are no active problems to display for this patient.   Past Surgical History:  Procedure Laterality Date  . OVARIAN CYST REMOVAL      OB History    No data available       Home Medications    Prior to Admission medications   Medication Sig Start Date End Date Taking? Authorizing Provider  hydrOXYzine (VISTARIL) 50 MG capsule Take 50 mg by mouth 3 (three) times daily as needed.   Yes [provider]  sertraline (ZOLOFT) 100 MG tablet Take 100 mg by mouth daily.   Yes [provider]  albuterol (PROVENTIL HFA;VENTOLIN HFA) 108 (90 BASE) MCG/ACT inhaler Inhale 2 puffs into the lungs every 4 (four) hours as needed for wheezing or shortness of breath (cough). 12/29/12   Wayland Salinas, MD  HYDROcodone-acetaminophen (NORCO) 5-325 MG per tablet Take 2 tablets by mouth every 4 (four) hours as needed for pain. 03/22/13   Sciacca, Marissa, PA-C  ibuprofen (ADVIL,MOTRIN) 200 MG tablet Take 200 mg by mouth every 6 (six) hours as needed for pain.    [provider]  ibuprofen (ADVIL,MOTRIN) 600 MG tablet Take 1 tablet (600 mg total) by mouth every 6 (six) hours as needed for pain. 03/22/13   Sciacca, Marissa, PA-C  methocarbamol (ROBAXIN) 500  MG tablet Take 1 tablet (500 mg total) by mouth 2 (two) times daily. 03/09/13   Palumbo, April, MD  metoCLOPramide (REGLAN) 10 MG tablet Take 1 tablet (10 mg total) by mouth every 6 (six) hours as needed (nausea/headache). 12/29/12   Wayland Salinas, MD  metroNIDAZOLE (FLAGYL) 500 MG tablet Take 1 tablet (500 mg total) by mouth 2 (two) times daily. One po bid x 7 days 11/07/13   Molpus, John, MD  naproxen (NAPROSYN) 500 MG tablet Take 1 tablet (500 mg total) by mouth 2 (two) times daily. 03/29/16   Cheri Fowler, PA-C  ondansetron (ZOFRAN ODT) 8 MG disintegrating tablet 8mg  ODT q4 hours prn nausea 12/29/12   Wayland Salinas, MD  Prenatal Vit-Fe Fumarate-FA (PRENATAL VITAMIN) 27-0.8 MG TABS Take 1 tablet by mouth daily. 06/17/16   Palumbo, April, MD  traMADol (ULTRAM) 50 MG tablet Take 1 tablet (50 mg total) by mouth every 6 (six) hours as needed for pain. 03/09/13   Palumbo, April, MD    Family History No family history on file.  Social History Social History  Substance Use Topics  . Smoking status: Current Every Day Smoker    Packs/day: 1.00    Types: Cigarettes  . Smokeless tobacco: Never Used  . Alcohol use No     Comment: denies     Allergies   Demerol [meperidine]  Review of Systems Review of Systems  Constitutional: Negative for chills and fever.  Respiratory: Negative for shortness of breath.   Cardiovascular: Negative for chest pain.  Gastrointestinal: Negative for abdominal pain, constipation, diarrhea, nausea and vomiting.  Genitourinary: Positive for vaginal discharge. Negative for dysuria, frequency, hematuria, pelvic pain, urgency, vaginal bleeding and vaginal pain.  Neurological: Negative for headaches.     Physical Exam Updated Vital Signs BP 117/85 (BP Location: Left Arm)   Pulse 84   Temp 98.4 F (36.9 C) (Oral)   Resp 18   Ht 5\' 4"  (1.626 m)   Wt 75.3 kg (166 lb)   LMP 07/24/2017   SpO2 100%   BMI 28.49 kg/m   Physical Exam  Constitutional: She is oriented to  person, place, and time. She appears well-developed and well-nourished. No distress.  HENT:  Head: Normocephalic and atraumatic.  Eyes: Conjunctivae and EOM are normal.  Neck: Normal range of motion.  Pulmonary/Chest: Effort normal.  Abdominal: Soft. There is no tenderness. There is no rebound and no guarding.  Genitourinary: Vaginal discharge (yellow) found.  Genitourinary Comments: Cervix without lesions, normal vaginal rugae  Musculoskeletal: Normal range of motion.  Neurological: She is alert and oriented to person, place, and time.  Skin: Skin is warm and dry. Capillary refill takes less than 2 seconds.  Psychiatric: She has a normal mood and affect.     ED Treatments / Results  Labs (all labs ordered are listed, but only abnormal results are displayed) Labs Reviewed  WET PREP, GENITAL - Abnormal; Notable for the following:       Result Value   Clue Cells Wet Prep HPF POC PRESENT (*)    WBC, Wet Prep HPF POC MANY (*)    All other components within normal limits  URINALYSIS, ROUTINE W REFLEX MICROSCOPIC - Abnormal; Notable for the following:    APPearance CLOUDY (*)    All other components within normal limits  PREGNANCY, URINE  GC/CHLAMYDIA PROBE AMP ([place]) NOT [facility]    EKG  EKG Interpretation None       Radiology No results found.  Procedures Procedures (including critical care time)  Medications Ordered in ED Medications - No data to display   Initial Impression / Assessment and Plan / ED Course  I have reviewed the triage vital signs and the nursing notes.  Pertinent labs & imaging results that were available during my care of the patient were reviewed by me and considered in my medical decision making (see chart for details).   Wet prep c/w bacterial vaginosis. Rx for metronidazole given. GC/chlamydia collected. Patient given contact information to establish with PCP to follow up for her h/o HTN. Of note, patient's BP in ED is well controlled  off medications.   Final Clinical Impressions(s) / ED Diagnoses   Final diagnoses:  BV (bacterial vaginosis)    New Prescriptions Discharge Medication List as of 07/31/2017 10:54 PM       Leland HerYoo, Lindel Marcell J, DO 07/31/17 2328    Tegeler, Canary Brimhristopher J, MD 08/02/17 2127

## 2017-07-31 NOTE — Discharge Instructions (Signed)
You were evaluated for  vaginal discharge in the ED today and was found to have bacterial vaginosis.  Please take metronidazole for the next 7 days. [facility] or [facility] to establish as a new patient.

## 2017-07-31 NOTE — ED Triage Notes (Signed)
Pt reports vaginal discharge x 1 week with an odor. No other symptoms

## 2017-08-01 LAB — GC/CHLAMYDIA PROBE AMP (~~LOC~~) NOT AT ARMC
Chlamydia: NEGATIVE
Neisseria Gonorrhea: NEGATIVE

## 2018-01-02 ENCOUNTER — Other Ambulatory Visit: Payer: Self-pay

## 2018-01-02 ENCOUNTER — Encounter (HOSPITAL_BASED_OUTPATIENT_CLINIC_OR_DEPARTMENT_OTHER): Payer: Self-pay | Admitting: *Deleted

## 2018-01-02 ENCOUNTER — Emergency Department (HOSPITAL_BASED_OUTPATIENT_CLINIC_OR_DEPARTMENT_OTHER)
Admission: EM | Admit: 2018-01-02 | Discharge: 2018-01-02 | Disposition: A | Discharge status: Home / Self Care | Payer: Medicaid Other | Attending: Emergency Medicine | Admitting: Emergency Medicine

## 2018-01-02 DIAGNOSIS — R0981 Nasal congestion: Secondary | ICD-10-CM | POA: Insufficient documentation

## 2018-01-02 DIAGNOSIS — Z79899 Other long term (current) drug therapy: Secondary | ICD-10-CM | POA: Diagnosis not present

## 2018-01-02 DIAGNOSIS — K529 Noninfective gastroenteritis and colitis, unspecified: Secondary | ICD-10-CM | POA: Insufficient documentation

## 2018-01-02 DIAGNOSIS — J45909 Unspecified asthma, uncomplicated: Secondary | ICD-10-CM | POA: Insufficient documentation

## 2018-01-02 DIAGNOSIS — R6883 Chills (without fever): Secondary | ICD-10-CM | POA: Diagnosis not present

## 2018-01-02 DIAGNOSIS — R1084 Generalized abdominal pain: Secondary | ICD-10-CM | POA: Diagnosis not present

## 2018-01-02 DIAGNOSIS — R1110 Vomiting, unspecified: Secondary | ICD-10-CM | POA: Diagnosis present

## 2018-01-02 DIAGNOSIS — R05 Cough: Secondary | ICD-10-CM | POA: Diagnosis not present

## 2018-01-02 DIAGNOSIS — F17210 Nicotine dependence, cigarettes, uncomplicated: Secondary | ICD-10-CM | POA: Insufficient documentation

## 2018-01-02 LAB — URINALYSIS, ROUTINE W REFLEX MICROSCOPIC
BILIRUBIN URINE: NEGATIVE
GLUCOSE, UA: NEGATIVE mg/dL
Hgb urine dipstick: NEGATIVE
Ketones, ur: NEGATIVE mg/dL
Leukocytes, UA: NEGATIVE
NITRITE: NEGATIVE
PH: 6 (ref 5.0–8.0)
Protein, ur: NEGATIVE mg/dL

## 2018-01-02 LAB — PREGNANCY, URINE: Preg Test, Ur: NEGATIVE

## 2018-01-02 MED ORDER — ONDANSETRON 4 MG PO TBDP
4.0000 mg | ORAL_TABLET | Freq: Once | ORAL | Status: AC
  Administered 2018-01-02: 4 mg via ORAL
  Filled 2018-01-02: qty 1

## 2018-01-02 MED ORDER — ONDANSETRON 4 MG PO TBDP: 4.0000 mg | ORAL_TABLET | Freq: Three times a day (TID) | ORAL | 0 refills | Status: AC | PRN

## 2018-01-02 NOTE — ED Triage Notes (Signed)
Cough and runny nose for a week. Vomiting and diarrhea started last night. Body aches.

## 2018-01-02 NOTE — ED Provider Notes (Signed)
[facility] EMERGENCY DEPARTMENT Provider Note   CSN: [id number] Arrival date & time: 01/02/18  1210     History   Chief Complaint Chief Complaint  Patient presents with  . Emesis    HPI Meagan Sellers is a 32 y.o. female.  HPI  Meagan Sellers is a 32 year old female with a history of asthma who presents to the emergency department for evaluation of diarrhea, emesis and generalized abdominal pain.  Patient states that she ate at cookout last night.  Reports that she woke up in the middle of the night with severe vomiting and diarrhea.  States that she has had vomiting 5-10 times since the middle the night.  She has been unable to hold down any food.  Also reports intermittent generalized abdominal pain which feels cramping in nature.  Denies any abdominal pain currently.  States that she also felt chills last night, although did not check her temperature with thermometer.  Reports that she has had cough, congestion over the past week as well.  Denies sick contacts with similar.  Denies chest pain, shortness of breath, headache, body aches, hematemesis, hematochezia, melena, vaginal discharge, vaginal bleeding, dysuria, urinary frequency.  She denies previous abdominal surgeries.  Past Medical History:  Diagnosis Date  . Asthma     There are no active problems to display for this patient.   Past Surgical History:  Procedure Laterality Date  . OVARIAN CYST REMOVAL      OB History    No data available       Home Medications    Prior to Admission medications   Medication Sig Start Date End Date Taking? Authorizing Provider  albuterol (PROVENTIL HFA;VENTOLIN HFA) 108 (90 BASE) MCG/ACT inhaler Inhale 2 puffs into the lungs every 4 (four) hours as needed for wheezing or shortness of breath (cough). 12/29/12  Yes Wayland Salinas, MD  hydrOXYzine (VISTARIL) 50 MG capsule Take 50 mg by mouth 3 (three) times daily as needed.    [provider]  ibuprofen (ADVIL,MOTRIN)  200 MG tablet Take 200 mg by mouth every 6 (six) hours as needed for pain.    [provider]  sertraline (ZOLOFT) 100 MG tablet Take 100 mg by mouth daily.    [provider]    Family History No family history on file.  Social History Social History   Tobacco Use  . Smoking status: Current Every Day Smoker    Packs/day: 1.00    Types: Cigarettes  . Smokeless tobacco: Never Used  Substance Use Topics  . Alcohol use: No    Alcohol/week: 0.0 oz    Comment: denies  . Drug use: No     Allergies   Demerol [meperidine]   Review of Systems Review of Systems  Constitutional: Positive for chills, fatigue and fever.  HENT: Positive for congestion and rhinorrhea. Negative for ear pain and sore throat.   Respiratory: Positive for cough. Negative for shortness of breath and wheezing.   Cardiovascular: Negative for chest pain.  Gastrointestinal: Positive for abdominal pain, diarrhea, nausea and vomiting. Negative for blood in stool.  Genitourinary: Negative for difficulty urinating, dysuria and frequency.  Musculoskeletal: Negative for myalgias.  Skin: Negative for rash.  Neurological: Negative for headaches.     Physical Exam Updated Vital Signs BP (!) 123/91   Pulse 89   Temp 98.5 F (36.9 C) (Oral)   Resp 16   Ht 5\' 3"  (1.6 m)   Wt 75.3 kg (166 lb)   LMP 12/17/2017  SpO2 96%   BMI 29.41 kg/m   Physical Exam  Constitutional: She is oriented to person, place, and time. She appears well-developed and well-nourished. No distress.  HENT:  Head: Normocephalic and atraumatic.  Mouth/Throat: No oropharyngeal exudate.  Mucous membranes moist.  Eyes: Conjunctivae are normal. Pupils are equal, round, and reactive to light. Right eye exhibits no discharge. Left eye exhibits no discharge.  Neck: Normal range of motion. Neck supple.  Cardiovascular: Normal rate and regular rhythm. Exam reveals no friction rub.  No murmur heard. Pulmonary/Chest: Effort  normal and breath sounds normal. No stridor. No respiratory distress. She has no wheezes. She has no rales.  Abdominal: Soft. Bowel sounds are normal.  Mild tenderness to palpation grossly over the abdomen.  No rebound, guarding or rigidity.  Murphy sign negative.  McBurney's point negative.  No CVA tenderness.  Musculoskeletal: Normal range of motion.  Lymphadenopathy:    She has no cervical adenopathy.  Neurological: She is alert and oriented to person, place, and time. Coordination normal.  Skin: Skin is warm and dry. Capillary refill takes less than 2 seconds. She is not diaphoretic.  Psychiatric: She has a normal mood and affect. Her behavior is normal.  Nursing note and vitals reviewed.    ED Treatments / Results  Labs (all labs ordered are listed, but only abnormal results are displayed) Labs Reviewed  URINALYSIS, ROUTINE W REFLEX MICROSCOPIC - Abnormal; Notable for the following components:      Result Value   Specific Gravity, Urine >1.030 (*)    All other components within normal limits  PREGNANCY, URINE    EKG  EKG Interpretation None       Radiology No results found.  Procedures Procedures (including critical care time)  Medications Ordered in ED Medications - No data to display   Initial Impression / Assessment and Plan / ED Course  I have reviewed the triage vital signs and the nursing notes.  Pertinent labs & imaging results that were available during my care of the patient were reviewed by me and considered in my medical decision making (see chart for details).    Pregnancy test negative. Patient with symptoms consistent with viral gastroenteritis.  Vitals are stable, no fever.  No signs of dehydration, tolerating PO fluids > 6 oz.  Lungs are clear.  No focal abdominal pain, no concern for appendicitis, cholecystitis, pancreatitis, ruptured viscus, UTI, kidney stone, or any other abdominal etiology.  Supportive therapy indicated with return if symptoms  worsen. Patient agrees and voices understanding to above plan. She has no complaints prior to discharge.    Final Clinical Impressions(s) / ED Diagnoses   Final diagnoses:  Gastroenteritis    ED Discharge Orders        Ordered    ondansetron (ZOFRAN ODT) 4 MG disintegrating tablet  Every 8 hours PRN     01/02/18 1552       Kellie ShropshireShrosbree, Kenyen Candy J, PA-C 01/02/18 1552    Nira Connardama, Pedro Eduardo, MD 01/02/18 1925

## 2018-01-02 NOTE — ED Notes (Signed)
Pt understood dc material. NAD noted. Script given at dc  

## 2018-01-02 NOTE — ED Notes (Signed)
Ginger Ale given for PO challenge.

## 2018-01-02 NOTE — Discharge Instructions (Signed)
You are not pregnant.   You can take Zofran as needed for nausea. Tylenol for fever and ibuprofen for any body aches you have.   Please stick to a bland diet including banana, rice, applesauce and toast until you are feeling better.  Please avoid greasy or spicy foods.  Please do not share any cups or drinks with others while you are sick.  Return to the emergency department if you have worsening stomach pain with fever greater than 100.4 F, vomiting that will not stop or have any new or worsening symptoms.

## 2019-10-05 ENCOUNTER — Encounter (HOSPITAL_BASED_OUTPATIENT_CLINIC_OR_DEPARTMENT_OTHER): Payer: Self-pay

## 2019-10-05 ENCOUNTER — Other Ambulatory Visit: Payer: Self-pay

## 2019-10-05 DIAGNOSIS — N899 Noninflammatory disorder of vagina, unspecified: Secondary | ICD-10-CM | POA: Insufficient documentation

## 2019-10-05 DIAGNOSIS — M542 Cervicalgia: Secondary | ICD-10-CM | POA: Diagnosis present

## 2019-10-05 DIAGNOSIS — Z5321 Procedure and treatment not carried out due to patient leaving prior to being seen by health care provider: Secondary | ICD-10-CM | POA: Diagnosis not present

## 2019-10-05 DIAGNOSIS — M546 Pain in thoracic spine: Secondary | ICD-10-CM | POA: Insufficient documentation

## 2019-10-05 LAB — URINALYSIS, ROUTINE W REFLEX MICROSCOPIC
Bilirubin Urine: NEGATIVE
Glucose, UA: NEGATIVE mg/dL
Hgb urine dipstick: NEGATIVE
Ketones, ur: NEGATIVE mg/dL
Leukocytes,Ua: NEGATIVE
Nitrite: NEGATIVE
Protein, ur: NEGATIVE mg/dL
Specific Gravity, Urine: >1.03 — ABNORMAL HIGH (ref 1.005–1.030)
pH: 6 (ref 5.0–8.0)

## 2019-10-05 LAB — PREGNANCY, URINE: Preg Test, Ur: NEGATIVE
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
## 2019-10-06 ENCOUNTER — Emergency Department (HOSPITAL_BASED_OUTPATIENT_CLINIC_OR_DEPARTMENT_OTHER)
Admission: EM | Admit: 2019-10-06 | Discharge: 2019-10-06 | Disposition: A | Discharge status: Home / Self Care | Payer: Medicaid Other | Attending: Emergency Medicine | Admitting: Emergency Medicine

## 2022-08-05 ENCOUNTER — Other Ambulatory Visit: Payer: Self-pay

## 2022-08-05 ENCOUNTER — Encounter (HOSPITAL_BASED_OUTPATIENT_CLINIC_OR_DEPARTMENT_OTHER): Payer: Self-pay | Admitting: Emergency Medicine

## 2022-08-05 ENCOUNTER — Emergency Department (HOSPITAL_BASED_OUTPATIENT_CLINIC_OR_DEPARTMENT_OTHER)
Admission: EM | Admit: 2022-08-05 | Discharge: 2022-08-05 | Disposition: A | Discharge status: Home / Self Care | Payer: Medicaid Other | Attending: Emergency Medicine | Admitting: Emergency Medicine

## 2022-08-05 ENCOUNTER — Emergency Department (HOSPITAL_BASED_OUTPATIENT_CLINIC_OR_DEPARTMENT_OTHER): Payer: Medicaid Other

## 2022-08-05 DIAGNOSIS — I10 Essential (primary) hypertension: Secondary | ICD-10-CM | POA: Diagnosis not present

## 2022-08-05 DIAGNOSIS — S62525A Nondisplaced fracture of distal phalanx of left thumb, initial encounter for closed fracture: Secondary | ICD-10-CM | POA: Diagnosis not present

## 2022-08-05 DIAGNOSIS — S6992XA Unspecified injury of left wrist, hand and finger(s), initial encounter: Secondary | ICD-10-CM | POA: Diagnosis present

## 2022-08-05 DIAGNOSIS — X58XXXA Exposure to other specified factors, initial encounter: Secondary | ICD-10-CM | POA: Insufficient documentation

## 2022-08-05 DIAGNOSIS — J45909 Unspecified asthma, uncomplicated: Secondary | ICD-10-CM | POA: Diagnosis not present

## 2022-08-05 MED ORDER — IBUPROFEN 600 MG PO TABS: 600.0000 mg | ORAL_TABLET | Freq: Four times a day (QID) | ORAL | 0 refills | Status: AC | PRN

## 2022-08-05 MED ORDER — IBUPROFEN 400 MG PO TABS
600.0000 mg | ORAL_TABLET | Freq: Once | ORAL | Status: AC
Start: 2022-08-05 — End: 2022-08-05
  Administered 2022-08-05: 600 mg via ORAL
  Filled 2022-08-05: qty 1

## 2022-08-05 MED ORDER — HYDROCODONE-ACETAMINOPHEN 5-325 MG PO TABS: 1.0000 | ORAL_TABLET | Freq: Once | ORAL | Status: DC

## 2022-08-05 NOTE — Discharge Instructions (Addendum)
The work-up today was overall consistent with fracture of the distal part of your left thumb.  You were placed in a splint on the emergency department.  Keep the splint on at all times to avoid movement of affected digit as well as the joint closest to it.  I have attached information to make an appointment with hand surgery for further follow-up in 7 to 10 days for reevaluation.  Note that ibuprofen along with your sertraline increases your bleeding risk.  Please not hesitate to return to the emergency department for worrisome signs symptoms we discussed become apparent.

## 2022-08-05 NOTE — ED Triage Notes (Signed)
Pt arrives pov, steady gait, c/o LT thumb injury last night at work. Swelling with tenderness noted

## 2022-08-05 NOTE — ED Notes (Signed)
Thumb splint applied with ace wrap in place in order to help stabilize the left 1st digit. Pt tolerated well, immediately had decrease in pain and discomfort, also implemented ice pack and elevation

## 2022-08-05 NOTE — ED Provider Notes (Signed)
[facility] EMERGENCY DEPARTMENT Provider Note   CSN: [id number] Arrival date & time: 08/05/22  1609     History  Chief Complaint  Patient presents with   Finger Injury    Meagan Sellers is a 36 y.o. female.  HPI   36 year old female presents emergency department with complaints of left thumb pain.  Patient states that her left thumb was bothering her last night when she went to bed.  She denies any known trauma to affected thumb.  She states that she uses weed regularly and "could have punched the ground."  Pain is isolated to the distal portion of patient's left thumb.  She denies any weakness or loss of motion in affected digit.  She notes some mild numbness secondary to swelling.  She presents emergency department today because of increased pain as well as concern for fracture.  Denies other musculoskeletal injury.  Past medical history significant for asthma.  Home Medications Prior to Admission medications   Medication Sig Start Date End Date Taking? Authorizing Provider  ibuprofen (ADVIL) 600 MG tablet Take 1 tablet (600 mg total) by mouth every 6 (six) hours as needed. 08/05/22  Yes Sherian Maroon A, PA  albuterol (PROVENTIL HFA;VENTOLIN HFA) 108 (90 BASE) MCG/ACT inhaler Inhale 2 puffs into the lungs every 4 (four) hours as needed for wheezing or shortness of breath (cough). 12/29/12   Wayland Salinas, MD  hydrOXYzine (VISTARIL) 50 MG capsule Take 50 mg by mouth 3 (three) times daily as needed.    [provider]  ondansetron (ZOFRAN ODT) 4 MG disintegrating tablet Take 1 tablet (4 mg total) by mouth every 8 (eight) hours as needed for nausea or vomiting. 01/02/18   Kellie Shropshire, PA-C  sertraline (ZOLOFT) 100 MG tablet Take 100 mg by mouth daily.    [provider]      Allergies    Demerol [meperidine]    Review of Systems   Review of Systems  All other systems reviewed and are negative.   Physical Exam Updated Vital Signs BP (!)  135/91   Pulse 74   Temp 99.6 F (37.6 C)   Resp 18   Ht 5\' 4"  (1.626 m)   Wt 70.3 kg   SpO2 100%   BMI 26.61 kg/m  Physical Exam Vitals and nursing note reviewed.  Constitutional:      General: She is not in acute distress.    Appearance: She is well-developed.  HENT:     Head: Normocephalic and atraumatic.  Eyes:     Conjunctiva/sclera: Conjunctivae normal.  Cardiovascular:     Rate and Rhythm: Normal rate and regular rhythm.     Heart sounds: No murmur heard. Pulmonary:     Effort: Pulmonary effort is normal. No respiratory distress.     Breath sounds: Normal breath sounds.  Abdominal:     Palpations: Abdomen is soft.     Tenderness: There is no abdominal tenderness.  Musculoskeletal:        General: No swelling.     Right hand: Normal.       Hands:     Cervical back: Neck supple.     Right lower leg: No edema.     Left lower leg: No edema.     Comments: Patient has tenderness to palpation of left first digit distal phalanx.  Swelling noted on the palmar aspect of affected digit with mild ecchymosis.  Patient has artificial nails on but no surrounding hematoma noted at the proximal portion  of patient's nailbed.  Nail not mobile.  Patient has full active range of motion of affected thumb.  She is complaining of mild numbness distal to injury.  Muscle strength 5 out of 5.  No tenderness to palpation in snuffbox area.  No tenderness with axial loading of the thumb.  Capillary refill less than 2 seconds.  Radial pulses full and intact bilaterally.  No tenderness to palpation of metacarpal or phalanxes bilaterally.  She has full active range of motion of wrists bilaterally.  Patient has tenderness to palpation along affected distal phalanx with no obvious breaks in skin.  Skin:    General: Skin is warm and dry.     Capillary Refill: Capillary refill takes less than 2 seconds.  Neurological:     Mental Status: She is alert.  Psychiatric:        Mood and Affect: Mood normal.      ED Results / Procedures / Treatments   Labs (all labs ordered are listed, but only abnormal results are displayed) Labs Reviewed - No data to display  EKG None  Radiology DG Finger Thumb Left  Result Date: 08/05/2022 CLINICAL DATA:  Trauma EXAM: LEFT THUMB 2+V COMPARISON:  None Available. FINDINGS: There is an acute nondisplaced transverse fracture through the proximal aspect of the first distal phalanx. There is no intra-articular extension. Joint spaces are well maintained and alignment is anatomic. Soft tissues are within normal limits. IMPRESSION: Acute nondisplaced fracture of the first distal phalanx. Electronically Signed   By: Darliss Cheney M.D.   On: 08/05/2022 16:39    Procedures Procedures    Medications Ordered in ED Medications  ibuprofen (ADVIL) tablet 600 mg (600 mg Oral Given 08/05/22 1704)    ED Course/ Medical Decision Making/ A&P                           Medical Decision Making Amount and/or Complexity of Data Reviewed Radiology: ordered.   This patient presents to the ED for concern of thumb pain, this involves an extensive number of treatment options, and is a complaint that carries with it a high risk of complications and morbidity.  The differential diagnosis includes fracture, strain/sprain, subungual hematoma, nailbed laceration, nail subluxation, scaphoid fracture, compartment syndrome, necrotizing fasciitis   Co morbidities that complicate the patient evaluation  See HPI   Additional history obtained:  Additional history obtained from EMR   Lab Tests:  N/a   Imaging Studies ordered:  I ordered imaging studies including x-ray left thumb I independently visualized and interpreted imaging which showed acute nondisplaced fracture of patient's left distal phalanx of first digit. I agree with the radiologist interpretation   Cardiac Monitoring: / EKG:  The patient was maintained on a cardiac monitor.  I personally viewed and  interpreted the cardiac monitored which showed an underlying rhythm of: Sinus rhythm   Consultations Obtained:  N/a   Problem List / ED Course / Critical interventions / Medication management  Thumb pain I ordered medication including ibuprofen for pain   Reevaluation of the patient after these medicines showed that the patient improved I have reviewed the patients home medicines and have made adjustments as needed   Social Determinants of Health:  Cigarette and marijuana use.  Denies illicit drug use   Test / Admission - Considered:  Left first digit distal phalanx fracture Vitals signs significant for mild hypertension with a blood pressure 135/91.  Recommend close follow-up with PCP regarding  elevation of blood pressure.. Otherwise within normal range and stable throughout visit. Imaging studies significant for: See above Patient's work-up overall positive for stable fracture as indicated above.  Patient was placed in a thumb splint placed to the distal phalanx and interphalangeal joint.  Patient was advised to follow-up with hand surgery within 7 to 10 days for reevaluation of symptoms.  Rest, ice, elevation and NSAID therapy recommended.  Patient was educated regarding increased bleeding risk with NSAIDs and sertraline; she still desired at home NSAID therapy.  Treatment plan was discussed at length with patient and she acknowledged understanding was agreeable to said plan. Worrisome signs and symptoms were discussed with the patient, and the patient acknowledged understanding to return to the ED if noticed. Patient was stable upon discharge.          Final Clinical Impression(s) / ED Diagnoses Final diagnoses:  Closed nondisplaced fracture of distal phalanx of left thumb, initial encounter    Rx / DC Orders ED Discharge Orders          Ordered    ibuprofen (ADVIL) 600 MG tablet  Every 6 hours PRN        08/05/22 1710              Peter Garter,  [place] 08/05/22 1720    Vanetta Mulders, MD 08/16/22 [phone]

## 2023-11-12 ENCOUNTER — Emergency Department (HOSPITAL_BASED_OUTPATIENT_CLINIC_OR_DEPARTMENT_OTHER): Payer: MEDICAID

## 2023-11-12 ENCOUNTER — Emergency Department (HOSPITAL_BASED_OUTPATIENT_CLINIC_OR_DEPARTMENT_OTHER)
Admission: EM | Admit: 2023-11-12 | Discharge: 2023-11-12 | Disposition: A | Discharge status: Home / Self Care | Payer: MEDICAID | Attending: Emergency Medicine | Admitting: Emergency Medicine

## 2023-11-12 ENCOUNTER — Encounter (HOSPITAL_BASED_OUTPATIENT_CLINIC_OR_DEPARTMENT_OTHER): Payer: Self-pay | Admitting: Urology

## 2023-11-12 DIAGNOSIS — M62838 Other muscle spasm: Secondary | ICD-10-CM | POA: Diagnosis not present

## 2023-11-12 DIAGNOSIS — Y92410 Unspecified street and highway as the place of occurrence of the external cause: Secondary | ICD-10-CM | POA: Insufficient documentation

## 2023-11-12 DIAGNOSIS — R0602 Shortness of breath: Secondary | ICD-10-CM | POA: Insufficient documentation

## 2023-11-12 DIAGNOSIS — J45909 Unspecified asthma, uncomplicated: Secondary | ICD-10-CM | POA: Insufficient documentation

## 2023-11-12 DIAGNOSIS — R059 Cough, unspecified: Secondary | ICD-10-CM | POA: Diagnosis present

## 2023-11-12 MED ORDER — NAPROXEN 500 MG PO TABS
500.0000 mg | ORAL_TABLET | Freq: Two times a day (BID) | ORAL | 0 refills | Status: AC
Start: 2023-11-12 — End: ?

## 2023-11-12 MED ORDER — IBUPROFEN 800 MG PO TABS
800.0000 mg | ORAL_TABLET | Freq: Once | ORAL | Status: AC
  Administered 2023-11-12: 800 mg via ORAL
  Filled 2023-11-12: qty 1

## 2023-11-12 MED ORDER — METHOCARBAMOL 500 MG PO TABS: 500.0000 mg | ORAL_TABLET | Freq: Two times a day (BID) | ORAL | 0 refills | Status: AC

## 2023-11-12 MED ORDER — METHOCARBAMOL 500 MG PO TABS
500.0000 mg | ORAL_TABLET | Freq: Once | ORAL | Status: AC
  Administered 2023-11-12: 500 mg via ORAL
  Filled 2023-11-12: qty 1
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
## 2023-11-12 NOTE — ED Notes (Signed)
Patient transported to X-ray 
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
